# Patient Record
Sex: Male | Born: 1980
Health system: Southern US, Community
[De-identification: ages and names within clinical notes are randomized; demographics above are authoritative.]

## PROBLEM LIST (undated history)

## (undated) DIAGNOSIS — D6861 Antiphospholipid syndrome: Secondary | ICD-10-CM

## (undated) DIAGNOSIS — I82409 Acute embolism and thrombosis of unspecified deep veins of unspecified lower extremity: Secondary | ICD-10-CM

## (undated) HISTORY — PX: HERNIA REPAIR: SHX51

---

## 2012-07-27 DIAGNOSIS — S069XAA Unspecified intracranial injury with loss of consciousness status unknown, initial encounter: Secondary | ICD-10-CM

## 2012-07-27 DIAGNOSIS — S069X9A Unspecified intracranial injury with loss of consciousness of unspecified duration, initial encounter: Secondary | ICD-10-CM

## 2012-07-27 HISTORY — DX: Unspecified intracranial injury with loss of consciousness of unspecified duration, initial encounter: S06.9X9A

## 2012-07-27 HISTORY — DX: Unspecified intracranial injury with loss of consciousness status unknown, initial encounter: S06.9XAA

## 2014-11-06 DIAGNOSIS — D689 Coagulation defect, unspecified: Secondary | ICD-10-CM | POA: Insufficient documentation

## 2015-04-05 DIAGNOSIS — R600 Localized edema: Secondary | ICD-10-CM | POA: Insufficient documentation

## 2015-05-29 DIAGNOSIS — M7710 Lateral epicondylitis, unspecified elbow: Secondary | ICD-10-CM | POA: Insufficient documentation

## 2015-10-21 ENCOUNTER — Encounter (HOSPITAL_BASED_OUTPATIENT_CLINIC_OR_DEPARTMENT_OTHER): Attending: Internal Medicine

## 2015-10-21 DIAGNOSIS — D6861 Antiphospholipid syndrome: Secondary | ICD-10-CM | POA: Insufficient documentation

## 2015-10-21 DIAGNOSIS — L97229 Non-pressure chronic ulcer of left calf with unspecified severity: Secondary | ICD-10-CM | POA: Diagnosis present

## 2015-10-21 DIAGNOSIS — I739 Peripheral vascular disease, unspecified: Secondary | ICD-10-CM | POA: Diagnosis not present

## 2015-10-21 DIAGNOSIS — L97521 Non-pressure chronic ulcer of other part of left foot limited to breakdown of skin: Secondary | ICD-10-CM | POA: Diagnosis not present

## 2015-10-21 DIAGNOSIS — Z7901 Long term (current) use of anticoagulants: Secondary | ICD-10-CM | POA: Insufficient documentation

## 2015-10-28 ENCOUNTER — Encounter (HOSPITAL_BASED_OUTPATIENT_CLINIC_OR_DEPARTMENT_OTHER): Attending: Internal Medicine

## 2015-10-28 DIAGNOSIS — L97521 Non-pressure chronic ulcer of other part of left foot limited to breakdown of skin: Secondary | ICD-10-CM | POA: Diagnosis not present

## 2015-10-28 DIAGNOSIS — L97221 Non-pressure chronic ulcer of left calf limited to breakdown of skin: Secondary | ICD-10-CM | POA: Insufficient documentation

## 2015-10-28 DIAGNOSIS — D6861 Antiphospholipid syndrome: Secondary | ICD-10-CM | POA: Insufficient documentation

## 2015-11-04 DIAGNOSIS — L97521 Non-pressure chronic ulcer of other part of left foot limited to breakdown of skin: Secondary | ICD-10-CM | POA: Diagnosis not present

## 2015-11-11 DIAGNOSIS — L97521 Non-pressure chronic ulcer of other part of left foot limited to breakdown of skin: Secondary | ICD-10-CM | POA: Diagnosis not present

## 2016-05-15 ENCOUNTER — Encounter (HOSPITAL_BASED_OUTPATIENT_CLINIC_OR_DEPARTMENT_OTHER): Attending: Internal Medicine

## 2016-05-15 DIAGNOSIS — D6861 Antiphospholipid syndrome: Secondary | ICD-10-CM | POA: Diagnosis not present

## 2016-05-15 DIAGNOSIS — L97321 Non-pressure chronic ulcer of left ankle limited to breakdown of skin: Secondary | ICD-10-CM | POA: Diagnosis not present

## 2016-05-15 DIAGNOSIS — I87332 Chronic venous hypertension (idiopathic) with ulcer and inflammation of left lower extremity: Secondary | ICD-10-CM | POA: Diagnosis present

## 2016-05-15 DIAGNOSIS — Z7901 Long term (current) use of anticoagulants: Secondary | ICD-10-CM | POA: Diagnosis not present

## 2016-05-22 DIAGNOSIS — I87332 Chronic venous hypertension (idiopathic) with ulcer and inflammation of left lower extremity: Secondary | ICD-10-CM | POA: Diagnosis not present

## 2016-05-29 ENCOUNTER — Encounter (HOSPITAL_BASED_OUTPATIENT_CLINIC_OR_DEPARTMENT_OTHER): Attending: Internal Medicine

## 2016-09-10 ENCOUNTER — Ambulatory Visit (INDEPENDENT_AMBULATORY_CARE_PROVIDER_SITE_OTHER): Payer: Self-pay | Admitting: Orthopaedic Surgery

## 2016-09-29 ENCOUNTER — Ambulatory Visit (INDEPENDENT_AMBULATORY_CARE_PROVIDER_SITE_OTHER): Payer: Self-pay | Admitting: Orthopaedic Surgery

## 2016-10-13 ENCOUNTER — Ambulatory Visit (INDEPENDENT_AMBULATORY_CARE_PROVIDER_SITE_OTHER): Admitting: Orthopaedic Surgery

## 2016-10-13 ENCOUNTER — Ambulatory Visit (INDEPENDENT_AMBULATORY_CARE_PROVIDER_SITE_OTHER)

## 2016-10-13 ENCOUNTER — Encounter (INDEPENDENT_AMBULATORY_CARE_PROVIDER_SITE_OTHER): Payer: Self-pay | Admitting: Orthopaedic Surgery

## 2016-10-13 DIAGNOSIS — M25532 Pain in left wrist: Secondary | ICD-10-CM | POA: Diagnosis not present

## 2016-10-13 MED ORDER — LIDOCAINE HCL 1 % IJ SOLN
0.3000 mL | INTRAMUSCULAR | Status: AC | PRN
  Administered 2016-10-13: .3 mL

## 2016-10-13 MED ORDER — BUPIVACAINE HCL 0.5 % IJ SOLN
0.3300 mL | INTRAMUSCULAR | Status: AC | PRN
  Administered 2016-10-13: .33 mL

## 2016-10-13 MED ORDER — METHYLPREDNISOLONE ACETATE 40 MG/ML IJ SUSP
13.3300 mg | INTRAMUSCULAR | Status: AC | PRN
  Administered 2016-10-13: 13.33 mg

## 2016-10-13 NOTE — Progress Notes (Signed)
Office Visit Note   Patient: Vincent SievertDerek Warner           Date of Birth: 02/23/1981           MRN: [id number] Visit Date: 10/13/2016              Requested by: Vivien PrestoKip A Corrington, MD [address] [place], [place] [address] PCP: Vivien PrestoORRINGTON,KIP A, MD   Assessment & Plan: Visit Diagnoses:  1. 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 We did place in an injection today and this region of pain. Patient is on Coumadin for chronic DVTs therefore cannot take NSAIDs. Continue with relative rest ice and wrist brace. Follow-up with me as needed.  Follow-Up Instructions: Return if symptoms worsen or fail to improve.   Orders:  Orders Placed This Encounter  Procedures  . XR Wrist Complete Left   No orders of the defined types were placed in this encounter.     Procedures: Hand/UE Inj Date/Time: 10/13/2016 12:57 PM Performed by: Tarry KosXU, Azarah Dacy M Authorized by: Tarry KosXU, Drayton Tieu M   Consent Given by:  Patient Timeout: prior to procedure the correct patient, procedure, and site was verified   Indications:  Pain Condition: osteoarthritis   Location:  Thumb Thumb joint: CMC joint. Prep: patient was prepped and draped in usual sterile fashion   Needle Size:  25 G Medications:  0.3 mL lidocaine 1 %; 0.33 mL bupivacaine 0.5 %; 13.33 mg methylPREDNISolone acetate 40 MG/ML     Clinical Data: No additional findings.   Subjective: Chief Complaint  Patient presents with  . Left Wrist - Pain    Patient is a pleasant 36 year old gentleman with pain at the base of his first dorsal webspace of his left hand. He is right-hand dominant. He denies any injuries. Feels very sore. He does do a lot of weightlifting. He has tried a steroid taper with no relief. This is been going on for 6 weeks. He endorses throbbing pain. It does not radiate.    Review of Systems  Constitutional: Negative.   All other systems reviewed and are  negative.    Objective: Vital Signs: There were no vitals taken for this visit.  Physical Exam  Constitutional: He is oriented to person, place, and time. He appears well-developed and well-nourished.  HENT:  Head: Normocephalic and atraumatic.  Eyes: Pupils are equal, round, and reactive to light.  Neck: Neck supple.  Pulmonary/Chest: Effort normal.  Abdominal: Soft.  Musculoskeletal: Normal range of motion.  Neurological: He is alert and oriented to person, place, and time.  Skin: Skin is warm.  Psychiatric: He has a normal mood and affect. His behavior is normal. Judgment and thought content normal.  Nursing note and vitals reviewed.   Ortho Exam Left hand exam shows negative Finkelstein's, negative grind test. His pain is directly deep to the first and second metacarpal articulation. He has no gross deformities. Specialty Comments:  No specialty comments available.  Imaging: Xr Wrist Complete Left  Result Date: 10/13/2016 No acute or structural abnormalities no evidence of fracture    PMFS History: There are no active problems to display for this patient.  No past medical history on file.  No family history on file.  No past surgical history on file. Social History   Occupational History  . Not on file.   Social History Main Topics  . Smoking status: Never Smoker  . Smokeless tobacco: Never  Used  . Alcohol use Not on file  . Drug use: Unknown  . Sexual activity: Not on file

## 2016-10-28 ENCOUNTER — Encounter (HOSPITAL_BASED_OUTPATIENT_CLINIC_OR_DEPARTMENT_OTHER): Attending: Surgery

## 2016-10-28 DIAGNOSIS — I83023 Varicose veins of left lower extremity with ulcer of ankle: Secondary | ICD-10-CM | POA: Insufficient documentation

## 2016-10-28 DIAGNOSIS — Z7901 Long term (current) use of anticoagulants: Secondary | ICD-10-CM | POA: Diagnosis not present

## 2016-10-28 DIAGNOSIS — Z86718 Personal history of other venous thrombosis and embolism: Secondary | ICD-10-CM | POA: Diagnosis not present

## 2016-10-28 DIAGNOSIS — L97521 Non-pressure chronic ulcer of other part of left foot limited to breakdown of skin: Secondary | ICD-10-CM | POA: Insufficient documentation

## 2016-10-28 DIAGNOSIS — L97321 Non-pressure chronic ulcer of left ankle limited to breakdown of skin: Secondary | ICD-10-CM | POA: Diagnosis not present

## 2016-10-28 DIAGNOSIS — D6861 Antiphospholipid syndrome: Secondary | ICD-10-CM | POA: Insufficient documentation

## 2016-10-28 DIAGNOSIS — I83025 Varicose veins of left lower extremity with ulcer other part of foot: Secondary | ICD-10-CM | POA: Diagnosis not present

## 2016-11-02 ENCOUNTER — Encounter (HOSPITAL_BASED_OUTPATIENT_CLINIC_OR_DEPARTMENT_OTHER)

## 2016-11-05 DIAGNOSIS — I83023 Varicose veins of left lower extremity with ulcer of ankle: Secondary | ICD-10-CM | POA: Diagnosis not present

## 2016-11-13 DIAGNOSIS — I83023 Varicose veins of left lower extremity with ulcer of ankle: Secondary | ICD-10-CM | POA: Diagnosis not present

## 2016-11-20 DIAGNOSIS — I83023 Varicose veins of left lower extremity with ulcer of ankle: Secondary | ICD-10-CM | POA: Diagnosis not present

## 2016-11-23 DIAGNOSIS — I83023 Varicose veins of left lower extremity with ulcer of ankle: Secondary | ICD-10-CM | POA: Diagnosis not present

## 2016-11-27 ENCOUNTER — Encounter (HOSPITAL_BASED_OUTPATIENT_CLINIC_OR_DEPARTMENT_OTHER): Attending: Internal Medicine

## 2016-11-27 DIAGNOSIS — L97321 Non-pressure chronic ulcer of left ankle limited to breakdown of skin: Secondary | ICD-10-CM | POA: Insufficient documentation

## 2016-11-27 DIAGNOSIS — D6861 Antiphospholipid syndrome: Secondary | ICD-10-CM | POA: Insufficient documentation

## 2016-11-27 DIAGNOSIS — I83023 Varicose veins of left lower extremity with ulcer of ankle: Secondary | ICD-10-CM | POA: Diagnosis not present

## 2016-12-04 DIAGNOSIS — I83023 Varicose veins of left lower extremity with ulcer of ankle: Secondary | ICD-10-CM | POA: Diagnosis not present

## 2016-12-04 DIAGNOSIS — Z7901 Long term (current) use of anticoagulants: Secondary | ICD-10-CM | POA: Insufficient documentation

## 2016-12-07 ENCOUNTER — Other Ambulatory Visit (HOSPITAL_COMMUNITY)
Admission: RE | Admit: 2016-12-07 | Discharge: 2016-12-07 | Disposition: A | Discharge status: Home / Self Care | Source: Other Acute Inpatient Hospital | Attending: Internal Medicine | Admitting: Internal Medicine

## 2016-12-07 DIAGNOSIS — L03116 Cellulitis of left lower limb: Secondary | ICD-10-CM | POA: Insufficient documentation

## 2016-12-07 DIAGNOSIS — I83023 Varicose veins of left lower extremity with ulcer of ankle: Secondary | ICD-10-CM | POA: Diagnosis not present

## 2016-12-09 LAB — AEROBIC CULTURE W GRAM STAIN (SUPERFICIAL SPECIMEN)

## 2016-12-09 LAB — AEROBIC CULTURE  (SUPERFICIAL SPECIMEN)

## 2017-01-07 ENCOUNTER — Ambulatory Visit (INDEPENDENT_AMBULATORY_CARE_PROVIDER_SITE_OTHER)

## 2017-01-07 ENCOUNTER — Ambulatory Visit (INDEPENDENT_AMBULATORY_CARE_PROVIDER_SITE_OTHER): Admitting: Orthopaedic Surgery

## 2017-01-07 DIAGNOSIS — M545 Low back pain, unspecified: Secondary | ICD-10-CM | POA: Insufficient documentation

## 2017-01-07 DIAGNOSIS — M5442 Lumbago with sciatica, left side: Secondary | ICD-10-CM | POA: Diagnosis not present

## 2017-01-07 MED ORDER — PREDNISONE 10 MG (21) PO TBPK: ORAL_TABLET | ORAL | 0 refills | Status: DC

## 2017-01-07 MED ORDER — METHOCARBAMOL 750 MG PO TABS: 750.0000 mg | ORAL_TABLET | Freq: Two times a day (BID) | ORAL | 0 refills | Status: DC | PRN

## 2017-01-07 NOTE — Progress Notes (Signed)
   Office Visit Note   Patient: Vincent SievertDerek Warner           Date of Birth: 03/28/1981           MRN: [id number] Visit Date: 01/07/2017              Requested by: Vivien Prestoorrington, Kip A, MD [phone] [place], [place] [address] PCP: Vivien Prestoorrington, Kip A, MD   Assessment & Plan: Visit Diagnoses:  1. Acute bilateral low back pain without sciatica     Plan: Acute lumbar strain. Robaxin and prednisone taper prescribed. Patient cannot take NSAIDs due to his Coumadin. Follow-up with me as needed. Questions encouraged and answered.  Follow-Up Instructions: Return if symptoms worsen or fail to improve.   Orders:  Orders Placed This Encounter  Procedures  . XR Lumbar Spine 2-3 Views   Meds ordered this encounter  Medications  . predniSONE (STERAPRED UNI-PAK 21 TAB) 10 MG (21) TBPK tablet    Sig: Take as directed    Dispense:  21 tablet    Refill:  0  . methocarbamol (ROBAXIN) 750 MG tablet    Sig: Take 1 tablet (750 mg total) by mouth 2 (two) times daily as needed for muscle spasms.    Dispense:  60 tablet    Refill:  0      Procedures: No procedures performed   Clinical Data: No additional findings.   Subjective: No chief complaint on file.   [name] comes in today with acute low back pain. He was doing a lot of weightlifting and he has had pain in his low back since then. He is feeling spasms. He feels like him back. He is not able to get comfortable. He does take Coumadin for chronic DVTs.    Review of Systems  Constitutional: Negative.   All other systems reviewed and are negative.    Objective: Vital Signs: There were no vitals taken for this visit.  Physical Exam  Constitutional: He is oriented to person, place, and time. He appears well-developed and well-nourished.  Pulmonary/Chest: Effort normal.  Abdominal: Soft.  Neurological: He is alert and oriented to person, place, and time.  Skin: Skin is warm.  Psychiatric: He has a normal mood and affect. His  behavior is normal. Judgment and thought content normal.  Nursing note and vitals reviewed.   Ortho Exam Bilateral lower extremity exam is normal. Motor sensory and reflexes. No focal deficits. Specialty Comments:  No specialty comments available.  Imaging: Xr Lumbar Spine 2-3 Views  Result Date: 01/07/2017 Positional scoliosis.  No DDD.    PMFS History: Patient Active Problem List   Diagnosis Date Noted  . Acute bilateral low back pain without sciatica 01/07/2017   No past medical history on file.  No family history on file.  No past surgical history on file. Social History   Occupational History  . Not on file.   Social History Main Topics  . Smoking status: Never Smoker  . Smokeless tobacco: Never Used  . Alcohol use Not on file  . Drug use: Unknown  . Sexual activity: Not on file

## 2017-03-15 ENCOUNTER — Encounter (HOSPITAL_BASED_OUTPATIENT_CLINIC_OR_DEPARTMENT_OTHER): Attending: Internal Medicine

## 2017-03-15 DIAGNOSIS — D6861 Antiphospholipid syndrome: Secondary | ICD-10-CM | POA: Insufficient documentation

## 2017-03-15 DIAGNOSIS — Z7901 Long term (current) use of anticoagulants: Secondary | ICD-10-CM | POA: Diagnosis not present

## 2017-03-15 DIAGNOSIS — L97311 Non-pressure chronic ulcer of right ankle limited to breakdown of skin: Secondary | ICD-10-CM | POA: Diagnosis not present

## 2017-03-15 DIAGNOSIS — I87332 Chronic venous hypertension (idiopathic) with ulcer and inflammation of left lower extremity: Secondary | ICD-10-CM | POA: Insufficient documentation

## 2017-03-24 ENCOUNTER — Other Ambulatory Visit (HOSPITAL_COMMUNITY): Payer: Self-pay | Admitting: Gastroenterology

## 2017-03-24 DIAGNOSIS — E237 Disorder of pituitary gland, unspecified: Secondary | ICD-10-CM

## 2017-04-01 ENCOUNTER — Encounter (HOSPITAL_BASED_OUTPATIENT_CLINIC_OR_DEPARTMENT_OTHER): Attending: Internal Medicine

## 2017-04-01 DIAGNOSIS — L97311 Non-pressure chronic ulcer of right ankle limited to breakdown of skin: Secondary | ICD-10-CM | POA: Insufficient documentation

## 2017-04-01 DIAGNOSIS — Z7901 Long term (current) use of anticoagulants: Secondary | ICD-10-CM | POA: Diagnosis not present

## 2017-04-01 DIAGNOSIS — D6861 Antiphospholipid syndrome: Secondary | ICD-10-CM | POA: Insufficient documentation

## 2017-04-01 DIAGNOSIS — I87332 Chronic venous hypertension (idiopathic) with ulcer and inflammation of left lower extremity: Secondary | ICD-10-CM | POA: Diagnosis not present

## 2017-04-01 DIAGNOSIS — L97322 Non-pressure chronic ulcer of left ankle with fat layer exposed: Secondary | ICD-10-CM | POA: Insufficient documentation

## 2017-04-05 ENCOUNTER — Ambulatory Visit (HOSPITAL_COMMUNITY)
Admission: RE | Admit: 2017-04-05 | Discharge: 2017-04-05 | Disposition: A | Discharge status: Home / Self Care | Source: Ambulatory Visit | Attending: Gastroenterology | Admitting: Gastroenterology

## 2017-04-05 DIAGNOSIS — E237 Disorder of pituitary gland, unspecified: Secondary | ICD-10-CM | POA: Diagnosis present

## 2017-04-05 MED ORDER — GADOBENATE DIMEGLUMINE 529 MG/ML IV SOLN
10.0000 mL | Freq: Once | INTRAVENOUS | Status: AC | PRN
  Administered 2017-04-05: 10 mL via INTRAVENOUS

## 2017-04-08 DIAGNOSIS — I87332 Chronic venous hypertension (idiopathic) with ulcer and inflammation of left lower extremity: Secondary | ICD-10-CM | POA: Diagnosis not present

## 2017-04-15 DIAGNOSIS — I87332 Chronic venous hypertension (idiopathic) with ulcer and inflammation of left lower extremity: Secondary | ICD-10-CM | POA: Diagnosis not present

## 2017-04-22 ENCOUNTER — Other Ambulatory Visit (HOSPITAL_COMMUNITY)
Admission: RE | Admit: 2017-04-22 | Discharge: 2017-04-22 | Disposition: A | Discharge status: Home / Self Care | Source: Other Acute Inpatient Hospital | Attending: Internal Medicine | Admitting: Internal Medicine

## 2017-04-22 DIAGNOSIS — L97311 Non-pressure chronic ulcer of right ankle limited to breakdown of skin: Secondary | ICD-10-CM | POA: Insufficient documentation

## 2017-04-22 DIAGNOSIS — I87332 Chronic venous hypertension (idiopathic) with ulcer and inflammation of left lower extremity: Secondary | ICD-10-CM | POA: Diagnosis not present

## 2017-04-28 LAB — AEROBIC/ANAEROBIC CULTURE (SURGICAL/DEEP WOUND)

## 2017-04-28 LAB — AEROBIC/ANAEROBIC CULTURE W GRAM STAIN (SURGICAL/DEEP WOUND)

## 2017-05-06 ENCOUNTER — Encounter (HOSPITAL_BASED_OUTPATIENT_CLINIC_OR_DEPARTMENT_OTHER): Attending: Internal Medicine

## 2017-05-06 DIAGNOSIS — I739 Peripheral vascular disease, unspecified: Secondary | ICD-10-CM | POA: Diagnosis not present

## 2017-05-06 DIAGNOSIS — D6861 Antiphospholipid syndrome: Secondary | ICD-10-CM | POA: Insufficient documentation

## 2017-05-06 DIAGNOSIS — I87332 Chronic venous hypertension (idiopathic) with ulcer and inflammation of left lower extremity: Secondary | ICD-10-CM | POA: Insufficient documentation

## 2017-05-06 DIAGNOSIS — Z86718 Personal history of other venous thrombosis and embolism: Secondary | ICD-10-CM | POA: Insufficient documentation

## 2017-05-06 DIAGNOSIS — B372 Candidiasis of skin and nail: Secondary | ICD-10-CM | POA: Insufficient documentation

## 2017-05-06 DIAGNOSIS — L97322 Non-pressure chronic ulcer of left ankle with fat layer exposed: Secondary | ICD-10-CM | POA: Diagnosis not present

## 2017-05-13 DIAGNOSIS — I87332 Chronic venous hypertension (idiopathic) with ulcer and inflammation of left lower extremity: Secondary | ICD-10-CM | POA: Diagnosis not present

## 2017-05-20 DIAGNOSIS — I87332 Chronic venous hypertension (idiopathic) with ulcer and inflammation of left lower extremity: Secondary | ICD-10-CM | POA: Diagnosis not present

## 2017-05-27 ENCOUNTER — Encounter (HOSPITAL_BASED_OUTPATIENT_CLINIC_OR_DEPARTMENT_OTHER)

## 2017-05-31 DIAGNOSIS — IMO0001 Reserved for inherently not codable concepts without codable children: Secondary | ICD-10-CM | POA: Insufficient documentation

## 2017-08-03 ENCOUNTER — Encounter (HOSPITAL_BASED_OUTPATIENT_CLINIC_OR_DEPARTMENT_OTHER): Attending: Internal Medicine

## 2017-08-03 DIAGNOSIS — Z86718 Personal history of other venous thrombosis and embolism: Secondary | ICD-10-CM | POA: Diagnosis not present

## 2017-08-03 DIAGNOSIS — Z7901 Long term (current) use of anticoagulants: Secondary | ICD-10-CM | POA: Diagnosis not present

## 2017-08-03 DIAGNOSIS — D6861 Antiphospholipid syndrome: Secondary | ICD-10-CM | POA: Insufficient documentation

## 2017-08-03 DIAGNOSIS — I87332 Chronic venous hypertension (idiopathic) with ulcer and inflammation of left lower extremity: Secondary | ICD-10-CM | POA: Insufficient documentation

## 2017-08-03 DIAGNOSIS — L97322 Non-pressure chronic ulcer of left ankle with fat layer exposed: Secondary | ICD-10-CM | POA: Diagnosis not present

## 2017-08-09 DIAGNOSIS — I87332 Chronic venous hypertension (idiopathic) with ulcer and inflammation of left lower extremity: Secondary | ICD-10-CM | POA: Diagnosis not present

## 2017-08-16 DIAGNOSIS — I87332 Chronic venous hypertension (idiopathic) with ulcer and inflammation of left lower extremity: Secondary | ICD-10-CM | POA: Diagnosis not present

## 2018-07-11 ENCOUNTER — Encounter: Payer: Self-pay | Admitting: Endocrinology

## 2018-07-18 ENCOUNTER — Ambulatory Visit (INDEPENDENT_AMBULATORY_CARE_PROVIDER_SITE_OTHER): Admitting: Internal Medicine

## 2018-07-18 VITALS — BP 130/70 | HR 74 | Ht 78.0 in | Wt 251.0 lb

## 2018-07-18 DIAGNOSIS — S069X9S Unspecified intracranial injury with loss of consciousness of unspecified duration, sequela: Secondary | ICD-10-CM | POA: Diagnosis not present

## 2018-07-18 DIAGNOSIS — Z8639 Personal history of other endocrine, nutritional and metabolic disease: Secondary | ICD-10-CM | POA: Diagnosis not present

## 2018-07-18 NOTE — Patient Instructions (Signed)
-   Please continue current Testosterone regimen 60 mg subcutaneous three times a week - Continue HCG at 50 units three times a week
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
## 2018-07-19 ENCOUNTER — Encounter: Payer: Self-pay | Admitting: Internal Medicine

## 2018-07-19 ENCOUNTER — Telehealth: Payer: Self-pay

## 2018-07-19 DIAGNOSIS — S069X9A Unspecified intracranial injury with loss of consciousness of unspecified duration, initial encounter: Secondary | ICD-10-CM | POA: Insufficient documentation

## 2018-07-19 DIAGNOSIS — Z86718 Personal history of other venous thrombosis and embolism: Secondary | ICD-10-CM | POA: Insufficient documentation

## 2018-07-19 DIAGNOSIS — Z8639 Personal history of other endocrine, nutritional and metabolic disease: Secondary | ICD-10-CM | POA: Insufficient documentation

## 2018-07-19 MED ORDER — CHORIONIC GONADOTROPIN 10000 UNITS IM SOLR: 500.0000 [IU] | INTRAMUSCULAR | 5 refills | Status: DC

## 2018-07-19 MED ORDER — "INSULIN SYRINGE 29G X 1/2"" 0.5 ML MISC": 6 refills | Status: DC

## 2018-07-19 MED ORDER — TESTOSTERONE CYPIONATE 200 MG/ML IM SOLN: 60.0000 mg | INTRAMUSCULAR | 4 refills | Status: DC

## 2018-07-19 MED ORDER — CHORIONIC GONADOTROPIN 10000 UNITS IM SOLR: 50.0000 [IU] | INTRAMUSCULAR | 5 refills | Status: DC

## 2018-07-19 NOTE — Telephone Encounter (Signed)
Please refill HCG for Pt.

## 2018-10-11 DIAGNOSIS — R41840 Attention and concentration deficit: Secondary | ICD-10-CM | POA: Insufficient documentation

## 2018-10-17 ENCOUNTER — Ambulatory Visit: Admitting: Internal Medicine

## 2019-03-01 ENCOUNTER — Encounter (HOSPITAL_BASED_OUTPATIENT_CLINIC_OR_DEPARTMENT_OTHER)

## 2019-06-08 DIAGNOSIS — I82519 Chronic embolism and thrombosis of unspecified femoral vein: Secondary | ICD-10-CM | POA: Insufficient documentation

## 2019-06-29 DIAGNOSIS — F900 Attention-deficit hyperactivity disorder, predominantly inattentive type: Secondary | ICD-10-CM | POA: Insufficient documentation

## 2019-08-16 ENCOUNTER — Ambulatory Visit: Payer: Self-pay | Attending: Internal Medicine

## 2019-08-16 DIAGNOSIS — Z20822 Contact with and (suspected) exposure to covid-19: Secondary | ICD-10-CM | POA: Insufficient documentation

## 2019-08-17 LAB — NOVEL CORONAVIRUS, NAA: SARS-CoV-2, NAA: NOT DETECTED

## 2019-12-12 DIAGNOSIS — J3089 Other allergic rhinitis: Secondary | ICD-10-CM | POA: Insufficient documentation

## 2019-12-12 DIAGNOSIS — Z800 Family history of malignant neoplasm of digestive organs: Secondary | ICD-10-CM | POA: Insufficient documentation

## 2020-03-01 ENCOUNTER — Telehealth: Payer: Self-pay | Admitting: Internal Medicine

## 2020-03-01 NOTE — Telephone Encounter (Signed)
Please decline for me, thanks.

## 2020-03-01 NOTE — Telephone Encounter (Signed)
Patient has only been seen one time and it was last year - but he called wanting to schedule a follow up and he said he wanted to see a male Dr instead - sending to Dr Everardo All to request switching to him.

## 2020-03-01 NOTE — Telephone Encounter (Signed)
Please refer to Dr. Ellison's response below 

## 2020-03-01 NOTE — Telephone Encounter (Signed)
Please review and advise.

## 2020-03-18 ENCOUNTER — Ambulatory Visit (INDEPENDENT_AMBULATORY_CARE_PROVIDER_SITE_OTHER): Admitting: Internal Medicine

## 2020-03-18 ENCOUNTER — Other Ambulatory Visit: Payer: Self-pay

## 2020-03-18 VITALS — BP 118/68 | HR 84 | Wt 241.4 lb

## 2020-03-18 DIAGNOSIS — E230 Hypopituitarism: Secondary | ICD-10-CM | POA: Diagnosis not present

## 2020-03-18 DIAGNOSIS — S069X9S Unspecified intracranial injury with loss of consciousness of unspecified duration, sequela: Secondary | ICD-10-CM | POA: Diagnosis not present

## 2020-03-18 LAB — COMPREHENSIVE METABOLIC PANEL
ALT: 30 U/L (ref 0–53)
AST: 24 U/L (ref 0–37)
Albumin: 4.7 g/dL (ref 3.5–5.2)
Alkaline Phosphatase: 53 U/L (ref 39–117)
BUN: 20 mg/dL (ref 6–23)
CO2: 29 mEq/L (ref 19–32)
Calcium: 9.9 mg/dL (ref 8.4–10.5)
Chloride: 99 mEq/L (ref 96–112)
Creatinine, Ser: 1.12 mg/dL (ref 0.40–1.50)
GFR: 72.17 mL/min (ref >60.00)
Glucose, Bld: 55 mg/dL — ABNORMAL LOW (ref 70–99)
Potassium: 4.2 mEq/L (ref 3.5–5.1)
Sodium: 137 mEq/L (ref 135–145)
Total Bilirubin: 0.8 mg/dL (ref 0.2–1.2)
Total Protein: 7.6 g/dL (ref 6.0–8.3)

## 2020-03-18 LAB — CBC WITH DIFFERENTIAL/PLATELET
Basophils Absolute: 0 10*3/uL (ref 0.0–0.1)
Basophils Relative: 0.5 % (ref 0.0–3.0)
Eosinophils Absolute: 0.7 10*3/uL (ref 0.0–0.7)
Eosinophils Relative: 8.1 % — ABNORMAL HIGH (ref 0.0–5.0)
HCT: 51.3 % (ref 39.0–52.0)
Hemoglobin: 17.2 g/dL — ABNORMAL HIGH (ref 13.0–17.0)
Lymphocytes Relative: 20.2 % (ref 12.0–46.0)
Lymphs Abs: 1.8 10*3/uL (ref 0.7–4.0)
MCHC: 33.6 g/dL (ref 30.0–36.0)
MCV: 88.3 fl (ref 78.0–100.0)
Monocytes Absolute: 0.9 10*3/uL (ref 0.1–1.0)
Monocytes Relative: 10.1 % (ref 3.0–12.0)
Neutro Abs: 5.6 10*3/uL (ref 1.4–7.7)
Neutrophils Relative %: 61.1 % (ref 43.0–77.0)
Platelets: 254 10*3/uL (ref 150.0–400.0)
RBC: 5.81 Mil/uL (ref 4.22–5.81)
RDW: 13 % (ref 11.5–15.5)
WBC: 9.1 10*3/uL (ref 4.0–10.5)

## 2020-03-18 LAB — PSA: PSA: 0.75 ng/mL (ref 0.10–4.00)

## 2020-03-18 LAB — TESTOSTERONE: Testosterone: 793.02 ng/dL (ref 300.00–890.00)
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
## 2020-03-18 NOTE — Patient Instructions (Signed)
Labs today please.

## 2020-03-19 MED ORDER — TESTOSTERONE CYPIONATE 200 MG/ML IM SOLN: 40.0000 mg | INTRAMUSCULAR | 4 refills | Status: DC

## 2020-03-21 ENCOUNTER — Encounter: Payer: Self-pay | Admitting: Internal Medicine

## 2020-03-21 ENCOUNTER — Ambulatory Visit: Admitting: Internal Medicine

## 2020-04-08 ENCOUNTER — Ambulatory Visit: Payer: Self-pay | Admitting: Gastroenterology

## 2020-08-07 ENCOUNTER — Telehealth: Payer: Self-pay | Admitting: Internal Medicine

## 2020-08-07 NOTE — Telephone Encounter (Signed)
Patient called to request the following RX's: human chorionic gonadotropin (PREGNYL/NOVAREL) 10000 units injection AND testosterone cypionate (DEPOTESTOSTERONE CYPIONATE) 200 MG/ML injection be sent to [facility] - [place], [place] - [address] Phone:  [phone]  Fax:  [phone]

## 2020-08-07 NOTE — Telephone Encounter (Signed)
Ok to refill? Last prescribed on 07/20/2018. Last OV on 03/18/2020.  Not sure why patient is schedule to see Dr Lucianne Muss on 08/28/2020

## 2020-08-08 MED ORDER — CHORIONIC GONADOTROPIN 10000 UNITS IM SOLR: 500.0000 [IU] | INTRAMUSCULAR | 5 refills | Status: DC

## 2020-08-08 NOTE — Telephone Encounter (Signed)
I refilled it. He wanted a male provider that's why he switched to Dr. Lucianne Muss. Moving forward Dr. Lucianne Muss will refill

## 2020-08-08 NOTE — Telephone Encounter (Signed)
Noted  

## 2020-08-27 NOTE — Progress Notes (Addendum)
Patient ID: Vincent Warner, male   DOB: 02/06/1981, 40 y.o.   MRN: [id number]            Reason for consultation: Low T   Chief complaint:   History of Present Illness  He is transferring care from Dr. Everlene Farrier.  Hypogonadism was diagnosed in 2014  Initially he had complaints of  significant fatigability and tiredness, decreased libido, but no gynecomastia The symptoms started sometime after he had head injury in military action Apparently had no other evidence of hypopituitarism or structural change in the pituitary gland on MRI.  Prolactin 20          There is no history of the following: Hot flushes, , long term anabolic steroid use, history of testicular injury mumps in childhood.   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  With his testosterone injections he feels much better although still tends to get tired He takes his injections on Monday/Wednesday and Friday using a 29-gauge needle  His lowest testosterone at baseline was 153 in 2016 and also subsequently was shown to have a low free testosterone  He is taking 40 mg testosterone 3 days a week, previously had been taking relatively higher dose from outside endocrinologist  Testosterone levels as follows:  Lab Results  Component Value Date   TESTOSTERONE 793.02 03/18/2020    Prolactin level: 20 at baseline  No results found for: [facility]  Lab Results  Component Value Date   HGB 17.2 (H) 03/18/2020                 Allergies as of 08/28/2020      Reactions   Tegaderm Ag Mesh [silver] Rash      Medication List       Accurate as of August 28, 2020  9:01 AM. If you have any questions, ask your nurse or doctor.        apixaban 5 MG  Tabs tablet Commonly known as: ELIQUIS Take 5 mg by mouth 2 (two) times daily.   human chorionic gonadotropin 88416 units injection Commonly known as: PREGNYL/NOVAREL Inject 0.5 mLs (500 Units total) into the muscle 3 (three) times a week.   INSULIN SYRINGE .5CC/29G 29G X 1/2" 0.5 ML Misc Commonly known as: Advocate Insulin Syringe Total 6 x a week   testosterone cypionate 200 MG/ML injection Commonly known as: DEPOTESTOSTERONE CYPIONATE Inject 0.2 mLs (40 mg total) into the muscle 3 (three) times a week.       Allergies:  Allergies  Allergen Reactions  . Tegaderm Ag Mesh [Silver] Rash    History reviewed. No pertinent past medical history.  History reviewed. No pertinent surgical history.  History reviewed. No pertinent family history.  Social History:  reports that he has never smoked. He has never used smokeless tobacco. No history on file for alcohol use and drug use.  Review of Systems  Constitutional:       Tends to have some sweating at night  HENT: Negative for headaches.   Eyes: Negative for blurred vision.  Endocrine: Negative for cold intolerance and decreased libido.  Neurological: Negative for weakness.      General Examination:   BP 128/82   Pulse 94  Ht 6\' 6"  (1.981 m)   Wt 253 lb 6.4 oz (114.9 kg)   SpO2 97%   BMI 29.28 kg/m   GENERAL APPEARANCE  he is well built and nourished  SKIN: normal, no rash  EYES: normal external appearance of eyes         NECK: no lymphadenopathy, no thyromegaly.         CHEST: Gynecomastia absent  LUNGS: clear to auscultation bilaterally, no wheezes, rhonchi, rales.           HEART: normal S1 And S2, no S3, S4, murmur or click.          ABDOMEN: no hepatosplenomegaly, no mass palpated, soft and not distended  MALE GENITOURINARY: left testicle relatively softer but normal size.  Right testicle is normal   MUSCULOSKELETAL No enlargement or deformity of joints.          EXTREMITIES: no clubbing, no  edema.         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  Discussed pros and cons of various treatments Discussed that he may have a tendency to higher hematocrit levels with testosterone injections and his hemoglobin was slightly increased on the last measurement  Currently he does not want to change his injection regimen and prefers to do this 3 days a week also  Since he wants to potentially preserve fertility he had wanted to continue hCG injections but he has not been able to get this for about 2 months.  Discussed that clomiphene will achieve the same benefit on testosterone production and spermatogenesis; may also be able to reduce the testosterone use Most likely will use clomiphene 25 mg 3 days a week  Labs will be checked today and also free T4 checked to rule out secondary hypothyroidism Blood counts and liver function will also be checked as no recent labs available    08/28/2020, 9:01 AM   Total encounter time including evaluation and management, reviewing historical previous records and labs, counseling =40 minutes  Note: This office note was prepared with Dragon voice recognition system technology. Any transcriptional errors that result from this process are unintentional.

## 2020-08-28 ENCOUNTER — Other Ambulatory Visit: Payer: Self-pay

## 2020-08-28 ENCOUNTER — Ambulatory Visit (INDEPENDENT_AMBULATORY_CARE_PROVIDER_SITE_OTHER): Admitting: Endocrinology

## 2020-08-28 ENCOUNTER — Encounter: Payer: Self-pay | Admitting: Endocrinology

## 2020-08-28 VITALS — BP 128/82 | HR 94 | Ht 78.0 in | Wt 253.4 lb

## 2020-08-28 DIAGNOSIS — E230 Hypopituitarism: Secondary | ICD-10-CM | POA: Diagnosis not present

## 2020-08-28 DIAGNOSIS — R5383 Other fatigue: Secondary | ICD-10-CM | POA: Diagnosis not present

## 2020-08-28 LAB — CBC
HCT: 48.5 % (ref 39.0–52.0)
Hemoglobin: 16.9 g/dL (ref 13.0–17.0)
MCHC: 34.8 g/dL (ref 30.0–36.0)
MCV: 86.1 fl (ref 78.0–100.0)
Platelets: 234 10*3/uL (ref 150.0–400.0)
RBC: 5.63 Mil/uL (ref 4.22–5.81)
RDW: 13.3 % (ref 11.5–15.5)
WBC: 7.7 10*3/uL (ref 4.0–10.5)

## 2020-08-28 LAB — TESTOSTERONE: Testosterone: 262.85 ng/dL — ABNORMAL LOW (ref 300.00–890.00)

## 2020-08-28 LAB — COMPREHENSIVE METABOLIC PANEL
ALT: 51 U/L (ref 0–53)
AST: 30 U/L (ref 0–37)
Albumin: 4.2 g/dL (ref 3.5–5.2)
Alkaline Phosphatase: 43 U/L (ref 39–117)
BUN: 23 mg/dL (ref 6–23)
CO2: 29 mEq/L (ref 19–32)
Calcium: 9.4 mg/dL (ref 8.4–10.5)
Chloride: 102 mEq/L (ref 96–112)
Creatinine, Ser: 0.95 mg/dL (ref 0.40–1.50)
GFR: 99.31 mL/min (ref >60.00)
Glucose, Bld: 89 mg/dL (ref 70–99)
Potassium: 4.1 mEq/L (ref 3.5–5.1)
Sodium: 136 mEq/L (ref 135–145)
Total Bilirubin: 0.7 mg/dL (ref 0.2–1.2)
Total Protein: 7.1 g/dL (ref 6.0–8.3)

## 2020-08-28 LAB — T4, FREE: Free T4: 0.94 ng/dL (ref 0.60–1.60)

## 2020-08-28 NOTE — Progress Notes (Signed)
Testosterone level is only 263, confirm that he has not missed any doses of injections lately. Since red blood cell count is still upper normal will not increase testosterone but start clomiphene tablets 50 mg, half tablet 3 days a week and follow-up as scheduled.

## 2020-09-02 ENCOUNTER — Other Ambulatory Visit: Payer: Self-pay | Admitting: *Deleted

## 2020-09-02 ENCOUNTER — Telehealth: Payer: Self-pay | Admitting: Endocrinology

## 2020-09-02 MED ORDER — TESTOSTERONE CYPIONATE 200 MG/ML IM SOLN: 40.0000 mg | INTRAMUSCULAR | 4 refills | Status: DC

## 2020-09-02 MED ORDER — CLOMIPHENE CITRATE 50 MG PO TABS: ORAL_TABLET | ORAL | 2 refills | Status: DC

## 2020-09-02 MED ORDER — CLOMIPHENE CITRATE 50 MG PO TABS: ORAL_TABLET | ORAL | 1 refills | Status: DC

## 2020-09-02 NOTE — Telephone Encounter (Signed)
Patient called and requested a refill of Testosterone Cypionate to be sent to [facility].  Patient is also requests that his Clomid be resent to [facility] as well.  Patient request that [facility] be his pharmacy of choice for long term medication and that [facility] be used for RX that are one time or immediate need fills

## 2020-09-02 NOTE — Telephone Encounter (Signed)
Rx has been printed and on Dr. Remus Blake desk for signature.

## 2020-09-13 ENCOUNTER — Telehealth: Payer: Self-pay | Admitting: Endocrinology

## 2020-09-13 ENCOUNTER — Other Ambulatory Visit: Payer: Self-pay | Admitting: *Deleted

## 2020-09-13 MED ORDER — "INSULIN SYRINGE 29G X 1/2"" 0.5 ML MISC": 6 refills | Status: DC

## 2020-09-13 NOTE — Telephone Encounter (Signed)
MEDICATION: insulin syringes  PHARMACY:    [facility] - [place], [place] - [address] Phone:  [phone]  Fax:  [phone]     HAS THE PATIENT CONTACTED THEIR PHARMACY?  no  IS THIS A 90 DAY SUPPLY : no  IS PATIENT OUT OF MEDICATION: no  LAST APPOINTMENT DATE: @2 /01/2021  NEXT APPOINTMENT DATE:@4 /07/2020   **Let patient know to contact pharmacy at the end of the day to make sure medication is ready. **  ** Please notify patient to allow 48-72 hours to process**  **Encourage patient to contact the pharmacy for refills or they can request refills through [facility]**

## 2020-09-13 NOTE — Telephone Encounter (Signed)
Rx sent 

## 2020-09-23 ENCOUNTER — Ambulatory Visit: Admitting: Endocrinology

## 2020-10-07 ENCOUNTER — Telehealth: Payer: Self-pay | Admitting: Internal Medicine

## 2020-10-07 NOTE — Telephone Encounter (Signed)
Please advise 

## 2020-10-07 NOTE — Telephone Encounter (Signed)
Patient has requested out bound referral to [facility]. Please send all records.

## 2020-10-07 NOTE — Telephone Encounter (Signed)
Message left for patient to return my call.  

## 2020-10-07 NOTE — Telephone Encounter (Signed)
He is Dr. Remus Blake

## 2020-10-07 NOTE — Telephone Encounter (Signed)
I am not referring him to urology, this is his own choice and will need to be done by his PCP

## 2020-10-25 ENCOUNTER — Other Ambulatory Visit

## 2020-10-30 ENCOUNTER — Ambulatory Visit: Admitting: Endocrinology

## 2021-01-17 ENCOUNTER — Emergency Department (HOSPITAL_BASED_OUTPATIENT_CLINIC_OR_DEPARTMENT_OTHER)

## 2021-01-17 ENCOUNTER — Encounter (HOSPITAL_BASED_OUTPATIENT_CLINIC_OR_DEPARTMENT_OTHER): Payer: Self-pay | Admitting: Emergency Medicine

## 2021-01-17 ENCOUNTER — Other Ambulatory Visit: Payer: Self-pay

## 2021-01-17 ENCOUNTER — Emergency Department (HOSPITAL_BASED_OUTPATIENT_CLINIC_OR_DEPARTMENT_OTHER)
Admission: EM | Admit: 2021-01-17 | Discharge: 2021-01-17 | Disposition: A | Discharge status: Home / Self Care | Attending: Emergency Medicine | Admitting: Emergency Medicine

## 2021-01-17 DIAGNOSIS — J36 Peritonsillar abscess: Secondary | ICD-10-CM

## 2021-01-17 DIAGNOSIS — D6861 Antiphospholipid syndrome: Secondary | ICD-10-CM | POA: Insufficient documentation

## 2021-01-17 DIAGNOSIS — Z20822 Contact with and (suspected) exposure to covid-19: Secondary | ICD-10-CM | POA: Diagnosis not present

## 2021-01-17 DIAGNOSIS — J029 Acute pharyngitis, unspecified: Secondary | ICD-10-CM | POA: Diagnosis present

## 2021-01-17 HISTORY — DX: Antiphospholipid syndrome: D68.61

## 2021-01-17 LAB — COMPREHENSIVE METABOLIC PANEL
ALT: 25 U/L (ref 0–44)
AST: 26 U/L (ref 15–41)
Albumin: 4.6 g/dL (ref 3.5–5.0)
Alkaline Phosphatase: 50 U/L (ref 38–126)
Anion gap: 10 (ref 5–15)
BUN: 14 mg/dL (ref 6–20)
CO2: 26 mmol/L (ref 22–32)
Calcium: 9.9 mg/dL (ref 8.9–10.3)
Chloride: 97 mmol/L — ABNORMAL LOW (ref 98–111)
Creatinine, Ser: 1.07 mg/dL (ref 0.61–1.24)
GFR, Estimated: >60 mL/min (ref >60)
Glucose, Bld: 92 mg/dL (ref 70–99)
Potassium: 3.9 mmol/L (ref 3.5–5.1)
Sodium: 133 mmol/L — ABNORMAL LOW (ref 135–145)
Total Bilirubin: 0.6 mg/dL (ref 0.3–1.2)
Total Protein: 8.5 g/dL — ABNORMAL HIGH (ref 6.5–8.1)

## 2021-01-17 LAB — CBC WITH DIFFERENTIAL/PLATELET
Abs Immature Granulocytes: 0.17 10*3/uL — ABNORMAL HIGH (ref 0.00–0.07)
Basophils Absolute: 0.1 10*3/uL (ref 0.0–0.1)
Basophils Relative: 0 %
Eosinophils Absolute: 0.1 10*3/uL (ref 0.0–0.5)
Eosinophils Relative: 1 %
HCT: 51.7 % (ref 39.0–52.0)
Hemoglobin: 17.7 g/dL — ABNORMAL HIGH (ref 13.0–17.0)
Immature Granulocytes: 1 %
Lymphocytes Relative: 8 %
Lymphs Abs: 1.6 10*3/uL (ref 0.7–4.0)
MCH: 29.4 pg (ref 26.0–34.0)
MCHC: 34.2 g/dL (ref 30.0–36.0)
MCV: 85.7 fL (ref 80.0–100.0)
Monocytes Absolute: 2.1 10*3/uL — ABNORMAL HIGH (ref 0.1–1.0)
Monocytes Relative: 11 %
Neutro Abs: 15.3 10*3/uL — ABNORMAL HIGH (ref 1.7–7.7)
Neutrophils Relative %: 79 %
Platelets: 230 10*3/uL (ref 150–400)
RBC: 6.03 MIL/uL — ABNORMAL HIGH (ref 4.22–5.81)
RDW: 12.6 % (ref 11.5–15.5)
WBC: 19.4 10*3/uL — ABNORMAL HIGH (ref 4.0–10.5)
nRBC: 0 % (ref 0.0–0.2)

## 2021-01-17 LAB — RESP PANEL BY RT-PCR (FLU A&B, COVID) ARPGX2
Influenza A by PCR: NEGATIVE
Influenza B by PCR: NEGATIVE
SARS Coronavirus 2 by RT PCR: NEGATIVE

## 2021-01-17 MED ORDER — IOHEXOL 300 MG/ML  SOLN
75.0000 mL | Freq: Once | INTRAMUSCULAR | Status: AC | PRN
  Administered 2021-01-17: 75 mL via INTRAVENOUS

## 2021-01-17 MED ORDER — SODIUM CHLORIDE 0.9 % IV BOLUS
1000.0000 mL | Freq: Once | INTRAVENOUS | Status: AC
  Administered 2021-01-17: 1000 mL via INTRAVENOUS

## 2021-01-17 MED ORDER — ACETAMINOPHEN 325 MG PO TABS
650.0000 mg | ORAL_TABLET | Freq: Once | ORAL | Status: AC
  Administered 2021-01-17: 650 mg via ORAL
  Filled 2021-01-17: qty 2

## 2021-01-17 MED ORDER — AMOXICILLIN-POT CLAVULANATE 875-125 MG PO TABS: 1.0000 | ORAL_TABLET | Freq: Two times a day (BID) | ORAL | 0 refills | Status: DC

## 2021-01-17 MED ORDER — METRONIDAZOLE 500 MG/100ML IV SOLN
500.0000 mg | Freq: Once | INTRAVENOUS | Status: AC
  Administered 2021-01-17: 500 mg via INTRAVENOUS
  Filled 2021-01-17: qty 100

## 2021-01-17 MED ORDER — DEXAMETHASONE SODIUM PHOSPHATE 10 MG/ML IJ SOLN
10.0000 mg | Freq: Once | INTRAMUSCULAR | Status: AC
  Administered 2021-01-17: 10 mg via INTRAVENOUS
  Filled 2021-01-17: qty 1

## 2021-01-17 MED ORDER — KETOROLAC TROMETHAMINE 30 MG/ML IJ SOLN
30.0000 mg | Freq: Once | INTRAMUSCULAR | Status: AC
  Administered 2021-01-17: 30 mg via INTRAVENOUS
  Filled 2021-01-17: qty 1

## 2021-01-17 MED ORDER — SODIUM CHLORIDE 0.9 % IV SOLN
2.0000 g | Freq: Once | INTRAVENOUS | Status: AC
  Administered 2021-01-17: 2 g via INTRAVENOUS
  Filled 2021-01-17: qty 20

## 2021-01-17 NOTE — ED Triage Notes (Signed)
Pt complains of chills sore throat and generalized body aches. Pt stated that he went t to the UC yesterday and was given oral penicillan. Pt was told to go to the ED if symptoms did not get better in the morning.   Pt has anti-phospholipid syndrome and is on eliquis

## 2021-01-17 NOTE — ED Notes (Signed)
States seen in am for sore throat but not feeling better.  Has body chills and aches. Denies SOB or cough

## 2021-01-17 NOTE — ED Provider Notes (Signed)
[facility] EMERGENCY DEPT Provider Note   CSN: [id number] Arrival date & time: 01/17/21  1320     History Chief Complaint  Patient presents with   Sore Throat    Vincent Warner is a 40 y.o. male.  The history is provided by the patient.  Sore Throat This is a new problem. The current episode started more than 2 days ago (3 days ago). The problem occurs constantly. The problem has been gradually worsening. Pertinent negatives include no chest pain, no abdominal pain, no headaches and no shortness of breath. The symptoms are aggravated by swallowing. Nothing relieves the symptoms. Treatments tried: penicillin. The treatment provided no relief.       Patient Active Problem List   Diagnosis Date Noted   Antiphospholipid antibody syndrome (HCC) 01/17/2021   No family history on file.     Home Medications Prior to Admission medications   Not on File    Allergies    Patient has no allergy information on record.  Review of Systems   Review of Systems  Constitutional:  Negative for chills and fever.  HENT:  Positive for sore throat. Negative for ear pain.   Eyes:  Negative for pain and visual disturbance.  Respiratory:  Negative for cough and shortness of breath.   Cardiovascular:  Negative for chest pain and palpitations.  Gastrointestinal:  Negative for abdominal pain and vomiting.  Genitourinary:  Negative for dysuria and hematuria.  Musculoskeletal:  Negative for arthralgias and back pain.  Skin:  Negative for color change and rash.  Neurological:  Negative for seizures, syncope and headaches.  All other systems reviewed and are negative.  Physical Exam Updated Vital Signs BP (!) 144/93 (BP Location: Right Arm)   Pulse (!) 109   Temp 98.3 F (36.8 C) (Oral)   Resp 19   Ht 6\' 6"  (1.981 m)   Wt 113.4 kg   SpO2 100%   BMI 28.89 kg/m   Physical Exam Vitals and nursing note reviewed.  Constitutional:      Appearance: Normal appearance.  HENT:      Head: Normocephalic and atraumatic.     Mouth/Throat:     Mouth: Mucous membranes are moist.      Comments: Uvula is midline.  Pharynx is erythematous.  There is palatal elevation, tonsillar enlargement, and exudate on the right side which is asymmetric with respect to the left  Patient is speaking, handling secretions Eyes:     Conjunctiva/sclera: Conjunctivae normal.  Pulmonary:     Effort: Pulmonary effort is normal. No respiratory distress.  Musculoskeletal:        General: No deformity. Normal range of motion.     Cervical back: Normal range of motion.  Lymphadenopathy:     Cervical: Cervical adenopathy present.  Skin:    General: Skin is warm and dry.  Neurological:     General: No focal deficit present.     Mental Status: He is alert and oriented to person, place, and time. Mental status is at baseline.  Psychiatric:        Mood and Affect: Mood normal.    ED Results / Procedures / Treatments   Labs (all labs ordered are listed, but only abnormal results are displayed) Labs Reviewed  CBC WITH DIFFERENTIAL/PLATELET - Abnormal; Notable for the following components:      Result Value   WBC 19.4 (*)    RBC 6.03 (*)    Hemoglobin 17.7 (*)    Neutro Abs 15.3 (*)  Monocytes Absolute 2.1 (*)    Abs Immature Granulocytes 0.17 (*)    All other components within normal limits  COMPREHENSIVE METABOLIC PANEL - Abnormal; Notable for the following components:   Sodium 133 (*)    Chloride 97 (*)    Total Protein 8.5 (*)    All other components within normal limits  RESP PANEL BY RT-PCR (FLU A&B, COVID) ARPGX2    EKG None  Radiology CT Soft Tissue Neck W Contrast  Result Date: 01/17/2021 CLINICAL DATA:  Chills, sore throat EXAM: CT NECK WITH CONTRAST TECHNIQUE: Multidetector CT imaging of the neck was performed using the standard protocol following the bolus administration of intravenous contrast. CONTRAST:  87mL OMNIPAQUE IOHEXOL 300 MG/ML  SOLN COMPARISON:  None.  FINDINGS: Pharynx and larynx: There is enlargement of the palatine tonsils with mildly heterogeneous enhancement. No peritonsillar abscess. Airway is narrowed but patent. Larynx is unremarkable. Salivary glands: Parotid and submandibular glands are unremarkable. Thyroid: Normal. Lymph nodes: There is likely reactive upper cervical lymphadenopathy. For example, right level 2 node measuring 2.2 cm. Vascular: Major neck vessels are patent. Limited intracranial: No abnormal enhancement. Visualized orbits: Unremarkable. Mastoids and visualized paranasal sinuses: Mild mucosal thickening. Mastoid air cells are clear. Skeleton: No acute or significant osseous abnormality. Upper chest: Included upper lungs are clear. Other: None. IMPRESSION: Pharyngitis/tonsillitis without evidence of peritonsillar abscess. Electronically Signed   By: Guadlupe Spanish M.D.   On: 01/17/2021 15:29    Procedures Procedures   Medications Ordered in ED Medications  metroNIDAZOLE (FLAGYL) IVPB 500 mg (has no administration in time range)  cefTRIAXone (ROCEPHIN) 2 g in sodium chloride 0.9 % 100 mL IVPB (2 g Intravenous New Bag/Given 01/17/21 1435)  ketorolac (TORADOL) 30 MG/ML injection 30 mg (has no administration in time range)  acetaminophen (TYLENOL) tablet 650 mg (has no administration in time range)  sodium chloride 0.9 % bolus 1,000 mL (1,000 mLs Intravenous New Bag/Given 01/17/21 1430)  dexamethasone (DECADRON) injection 10 mg (10 mg Intravenous Given 01/17/21 1432)    ED Course  I have reviewed the triage vital signs and the nursing notes.  Pertinent labs & imaging results that were available during my care of the patient were reviewed by me and considered in my medical decision making (see chart for details).    MDM Rules/Calculators/A&P                          Vincent Warner presents with sore throat and fever.  It appears that he may have a peritonsillar abscess.  He will be given antibiotics, steroids, and IV  fluids.  Labs and CT scan will be performed.  CT scan is not consistent with a deep soft tissue abscess or peritonsillar abscess.  He will be discharged home with a prescription for Augmentin.  He should stop his penicillin.  He was given information about ENT follow-up. Final Clinical Impression(s) / ED Diagnoses Final diagnoses:  Peritonsillar cellulitis    Rx / DC Orders ED Discharge Orders          Ordered    amoxicillin-clavulanate (AUGMENTIN) 875-125 MG tablet  Every 12 hours        01/17/21 1535             Koleen Distance, MD 01/17/21 1536

## 2021-02-23 ENCOUNTER — Other Ambulatory Visit: Payer: Self-pay

## 2021-02-23 ENCOUNTER — Emergency Department (HOSPITAL_BASED_OUTPATIENT_CLINIC_OR_DEPARTMENT_OTHER)
Admission: EM | Admit: 2021-02-23 | Discharge: 2021-02-23 | Disposition: A | Discharge status: Home / Self Care | Attending: Emergency Medicine | Admitting: Emergency Medicine

## 2021-02-23 ENCOUNTER — Encounter (HOSPITAL_BASED_OUTPATIENT_CLINIC_OR_DEPARTMENT_OTHER): Payer: Self-pay | Admitting: Emergency Medicine

## 2021-02-23 DIAGNOSIS — Z7901 Long term (current) use of anticoagulants: Secondary | ICD-10-CM | POA: Insufficient documentation

## 2021-02-23 DIAGNOSIS — Z23 Encounter for immunization: Secondary | ICD-10-CM | POA: Insufficient documentation

## 2021-02-23 DIAGNOSIS — M7989 Other specified soft tissue disorders: Secondary | ICD-10-CM | POA: Diagnosis present

## 2021-02-23 DIAGNOSIS — L03011 Cellulitis of right finger: Secondary | ICD-10-CM | POA: Insufficient documentation

## 2021-02-23 MED ORDER — AMOXICILLIN-POT CLAVULANATE 875-125 MG PO TABS
1.0000 | ORAL_TABLET | Freq: Once | ORAL | Status: AC
  Administered 2021-02-23: 1 via ORAL
  Filled 2021-02-23: qty 1

## 2021-02-23 MED ORDER — LIDOCAINE-EPINEPHRINE (PF) 2 %-1:200000 IJ SOLN
20.0000 mL | Freq: Once | INTRAMUSCULAR | Status: AC
  Administered 2021-02-23: 20 mL
  Filled 2021-02-23: qty 20

## 2021-02-23 MED ORDER — AMOXICILLIN-POT CLAVULANATE 875-125 MG PO TABS: 1.0000 | ORAL_TABLET | Freq: Two times a day (BID) | ORAL | 0 refills | Status: DC

## 2021-02-23 MED ORDER — TETANUS-DIPHTH-ACELL PERTUSSIS 5-2.5-18.5 LF-MCG/0.5 IM SUSY
0.5000 mL | PREFILLED_SYRINGE | Freq: Once | INTRAMUSCULAR | Status: AC
  Administered 2021-02-23: 0.5 mL via INTRAMUSCULAR
  Filled 2021-02-23: qty 0.5

## 2021-02-23 NOTE — ED Provider Notes (Signed)
[facility] EMERGENCY DEPT Provider Note   CSN: [id number] Arrival date & time: 02/23/21  1134     History Chief Complaint  Patient presents with   Hand Pain    Vincent Warner is a 40 y.o. male.  Patient c/o swelling, pain and drainage to right thumb. Symptoms in past couple days - acute onset, moderate, constant, persistent. Patient drained small amount pus w sharp tweezer edge. Denies recent injury to thumb. No hx same pain. Does not bite nails. No fever or chills. Last tetanus unknown.  The history is provided by the patient.  Hand Pain      Past Medical History:  Diagnosis Date   Antiphospholipid antibody syndrome [facility])     Patient Active Problem List   Diagnosis Date Noted   Antiphospholipid antibody syndrome (HCC) 01/17/2021    History reviewed. No pertinent surgical history.     History reviewed. No pertinent family history.     Home Medications Prior to Admission medications   Medication Sig Start Date End Date Taking? Authorizing Provider  amoxicillin-clavulanate (AUGMENTIN) 875-125 MG tablet Take 1 tablet by mouth every 12 (twelve) hours. 01/17/21   Koleen Distance, MD  clomiPHENE (CLOMID) 50 MG tablet Take by mouth. 09/26/20   [provider]  ELIQUIS 5 MG TABS tablet Take 1 tablet by mouth 2 (two) times daily. 10/25/20   [provider]  human chorionic gonadotropin (PREGNYL/NOVAREL) 10000 units injection Inject into the muscle. 10/14/20   [provider]  testosterone cypionate (DEPOTESTOSTERONE CYPIONATE) 200 MG/ML injection Inject into the muscle. 10/20/20   [provider]    Allergies    Patient has no allergy information on record.  Review of Systems   Review of Systems  Constitutional:  Negative for chills and fever.  Musculoskeletal:        Right thumb infection/paronychia  Skin:  Negative for rash.  Neurological:  Negative for numbness.   Physical Exam Updated Vital Signs BP 113/81   Pulse  75   Temp 97.9 F (36.6 C) (Oral)   Resp 16   Ht 1.981 m (6\' 6" )   Wt 108.9 kg   SpO2 98%   BMI 27.73 kg/m   Physical Exam Vitals and nursing note reviewed.  Constitutional:      Appearance: Normal appearance. He is well-developed.  HENT:     Nose: Nose normal.     Mouth/Throat:     Mouth: Mucous membranes are moist.  Eyes:     General: No scleral icterus.    Conjunctiva/sclera: Conjunctivae normal.  Neck:     Trachea: No tracheal deviation.  Cardiovascular:     Rate and Rhythm: Normal rate.     Pulses: Normal pulses.  Pulmonary:     Effort: Pulmonary effort is normal. No accessory muscle usage or respiratory distress.  Genitourinary:    Comments: No cva tenderness. Musculoskeletal:     Cervical back: Neck supple.     Comments: Paronychia right thumb, with mild surrounding erythema. Normal cap refill distally. No pain w passive rom thumb, no tenosynovitis.   Skin:    General: Skin is warm and dry.     Findings: No rash.  Neurological:     Mental Status: He is alert.     Comments: Alert, speech clear. Thumb nvi.   Psychiatric:        Mood and Affect: Mood normal.    ED Results / Procedures / Treatments   Labs (all labs ordered are listed, but only  abnormal results are displayed) Labs Reviewed - No data to display  EKG None  Radiology No results found.  Procedures .Marland KitchenIncision and Drainage  Date/Time: 02/23/2021 1:23 PM Performed by: Cathren Laine, MD Authorized by: Cathren Laine, MD   Consent:    Consent given by:  Patient Location:    Location:  Upper extremity   Upper extremity location:  Finger   Finger location:  R thumb Pre-procedure details:    Skin preparation:  Chlorhexidine with alcohol Anesthesia:    Anesthesia method:  Nerve block   Block location:  Right thumb digit block, total 5 ccs   Block anesthetic:  Lidocaine 2% w/o epi   Block technique:  Digit block   Block outcome:  Anesthesia achieved Procedure type:    Complexity:   Complex Procedure details:    Incision depth:  Subcutaneous   Wound management:  Probed and deloculated and irrigated with saline   Drainage:  Purulent   Drainage amount:  Moderate   Wound treatment:  Wound left open   Packing materials:  None Post-procedure details:    Procedure completion:  Tolerated well, no immediate complications   Medications Ordered in ED Medications  lidocaine-EPINEPHrine (XYLOCAINE W/EPI) 2 %-1:200000 (PF) injection 20 mL (20 mLs Infiltration Given by Other 02/23/21 1321)    ED Course  I have reviewed the triage vital signs and the nursing notes.  Pertinent labs & imaging results that were available during my care of the patient were reviewed by me and considered in my medical decision making (see chart for details).    MDM Rules/Calculators/A&P                           Tetanus im.   I and D of paronychia after digit block.   Reviewed nursing notes and prior charts for additional history.   W mild surrounding erythema, will give abx rx.   Post digit block and I and D. Cap refill normally, finger normal color/warmth.   Return precautions provided.    Final Clinical Impression(s) / ED Diagnoses Final diagnoses:  None    Rx / DC Orders ED Discharge Orders     None        Cathren Laine, MD 02/23/21 1325

## 2021-02-23 NOTE — ED Triage Notes (Signed)
Patient has a paronychia to the right thumb. Patient reports he tried to drain the finger without much success. Patient reports he got some yellow puss from the area.

## 2021-02-23 NOTE — Discharge Instructions (Addendum)
It was our pleasure to provide your ER care today - we hope that you feel better.  Keep area very clean. Take antibiotic as prescribed.   Follow up with primary care doctor in 1 week if symptoms fail to improve/resolve.  Return to ER if worse, new symptoms, severe pain, spreading redness, increased swelling, fevers, or other concern.

## 2021-04-14 ENCOUNTER — Encounter: Payer: Self-pay | Admitting: Endocrinology

## 2021-04-27 ENCOUNTER — Emergency Department (HOSPITAL_BASED_OUTPATIENT_CLINIC_OR_DEPARTMENT_OTHER): Admit: 2021-04-27

## 2021-04-27 ENCOUNTER — Emergency Department (HOSPITAL_BASED_OUTPATIENT_CLINIC_OR_DEPARTMENT_OTHER)
Admission: EM | Admit: 2021-04-27 | Discharge: 2021-04-27 | Disposition: A | Discharge status: Home / Self Care | Attending: Emergency Medicine | Admitting: Emergency Medicine

## 2021-04-27 ENCOUNTER — Other Ambulatory Visit: Payer: Self-pay

## 2021-04-27 ENCOUNTER — Encounter (HOSPITAL_BASED_OUTPATIENT_CLINIC_OR_DEPARTMENT_OTHER): Payer: Self-pay | Admitting: *Deleted

## 2021-04-27 DIAGNOSIS — Z7901 Long term (current) use of anticoagulants: Secondary | ICD-10-CM | POA: Insufficient documentation

## 2021-04-27 DIAGNOSIS — N50819 Testicular pain, unspecified: Secondary | ICD-10-CM

## 2021-04-27 DIAGNOSIS — N50812 Left testicular pain: Secondary | ICD-10-CM | POA: Insufficient documentation

## 2021-04-27 HISTORY — DX: Acute embolism and thrombosis of unspecified deep veins of unspecified lower extremity: I82.409

## 2021-04-27 MED ORDER — KETOROLAC TROMETHAMINE 60 MG/2ML IM SOLN
15.0000 mg | Freq: Once | INTRAMUSCULAR | Status: AC
  Administered 2021-04-27: 15 mg via INTRAMUSCULAR
  Filled 2021-04-27: qty 2

## 2021-04-27 NOTE — ED Provider Notes (Signed)
[facility] EMERGENCY DEPT Provider Note   CSN: [id number] Arrival date & time: 04/27/21  0107     History Chief Complaint  Patient presents with   Other    Testicle pain     Vincent Warner is a 40 y.o. male.  40 yo M with a chief complaints of testicular pain.  This been off and on for months.  Usually worse in the left than the right testicle.  Typically happens when he is doing some heavy lifting.  Had an event today where he was doing some yard work and lifting some heavy logs for a neighbor and after which she developed some worsening left testicle pain.  States that it was very swollen and actually had pain to the right testicle as well.  Denies trauma to the area.  He applied ice with some improvement.  He did see a urologist recently though was for a different problem and did not have his testicles evaluated.    The history is provided by the patient.  Illness Severity:  Moderate Onset quality:  Gradual Duration:  2 months Timing:  Intermittent Progression:  Waxing and waning Chronicity:  New Associated symptoms: no abdominal pain, no chest pain, no congestion, no diarrhea, no fever, no headaches, no myalgias, no rash, no shortness of breath and no vomiting       Past Medical History:  Diagnosis Date   Antiphospholipid antibody syndrome (HCC)    DVT (deep venous thrombosis) (HCC)    Traumatic brain injury 2014    Patient Active Problem List   Diagnosis Date Noted   Antiphospholipid antibody syndrome (HCC) 01/17/2021   HGHG (hypogonadotropic hypogonadism) (HCC) 03/18/2020   Traumatic brain injury with loss of consciousness (HCC) 07/19/2018   H/O secondary hypogonadism 07/19/2018   Hx of deep venous thrombosis 07/19/2018   Acute bilateral low back pain without sciatica 01/07/2017    Past Surgical History:  Procedure Laterality Date   HERNIA REPAIR         No family history on file.  Tobacco Use   Smokeless tobacco: Never    Home  Medications Prior to Admission medications   Medication Sig Start Date End Date Taking? Authorizing Provider  apixaban (ELIQUIS) 5 MG TABS tablet Take 5 mg by mouth 2 (two) times daily.    [provider]  clomiPHENE (CLOMID) 50 MG tablet Take 1/2 tablet 3 times a week. 09/02/20   Reather Littler, MD  clomiPHENE (CLOMID) 50 MG tablet Take by mouth. 09/26/20   [provider]  ELIQUIS 5 MG TABS tablet Take 1 tablet by mouth 2 (two) times daily. 10/25/20   [provider]  human chorionic gonadotropin (PREGNYL/NOVAREL) 10000 units injection Inject 0.5 mLs (500 Units total) into the muscle 3 (three) times a week. 08/09/20   Shamleffer, Konrad Dolores, MD  human chorionic gonadotropin (PREGNYL/NOVAREL) 10000 units injection Inject into the muscle. 10/14/20   [provider]  INSULIN SYRINGE .5CC/29G (ADVOCATE INSULIN SYRINGE) 29G X 1/2" 0.5 ML MISC Total 6 x a week 09/13/20   Reather Littler, MD  testosterone cypionate (DEPOTESTOSTERONE CYPIONATE) 200 MG/ML injection Inject 0.2 mLs (40 mg total) into the muscle 3 (three) times a week. 09/02/20   Reather Littler, MD  testosterone cypionate (DEPOTESTOSTERONE CYPIONATE) 200 MG/ML injection Inject into the muscle. 10/20/20   [provider]    Allergies    Tegaderm ag mesh [silver]  Review of Systems   Review of Systems  Constitutional:  Negative for chills and fever.  HENT:  Negative for congestion and facial swelling.   Eyes:  Negative for discharge and visual disturbance.  Respiratory:  Negative for shortness of breath.   Cardiovascular:  Negative for chest pain and palpitations.  Gastrointestinal:  Negative for abdominal pain, diarrhea and vomiting.  Genitourinary:  Positive for testicular pain.  Musculoskeletal:  Negative for arthralgias and myalgias.  Skin:  Negative for color change and rash.  Neurological:  Negative for tremors, syncope and headaches.  Psychiatric/Behavioral:  Negative for confusion and dysphoric  mood.    Physical Exam Updated Vital Signs BP 127/90 (BP Location: Right Arm)   Pulse (!) 102   Resp 20   Ht 6\' 6"  (1.981 m)   Wt 108.9 kg   SpO2 100%   BMI 27.73 kg/m   Physical Exam Vitals and nursing note reviewed.  Constitutional:      Appearance: He is well-developed.  HENT:     Head: Normocephalic and atraumatic.  Eyes:     Pupils: Pupils are equal, round, and reactive to light.  Neck:     Vascular: No JVD.  Cardiovascular:     Rate and Rhythm: Normal rate and regular rhythm.     Heart sounds: No murmur heard.   No friction rub. No gallop.  Pulmonary:     Effort: No respiratory distress.     Breath sounds: No wheezing.  Abdominal:     General: There is no distension.     Tenderness: There is no abdominal tenderness. There is no guarding or rebound.  Genitourinary:    Comments: Mild tenderness to the testicles bilaterally.  Normal lie.  Cremasteric reflex intact bilaterally.  Mild fullness of the epididymis bilaterally.  Pain with insertion of the finger into the left inguinal canal.  No obvious bulging or masses.  No significant pain along the attachment of the adductor muscle to the pubic bone. Musculoskeletal:        General: Normal range of motion.     Cervical back: Normal range of motion and neck supple.  Skin:    Coloration: Skin is not pale.     Findings: No rash.  Neurological:     Mental Status: He is alert and oriented to person, place, and time.  Psychiatric:        Behavior: Behavior normal.    ED Results / Procedures / Treatments   Labs (all labs ordered are listed, but only abnormal results are displayed) Labs Reviewed - No data to display  EKG None  Radiology No results found.  Procedures Procedures   Medications Ordered in ED Medications  ketorolac (TORADOL) injection 15 mg (15 mg Intramuscular Given 04/27/21 0148)    ED Course  I have reviewed the triage vital signs and the nursing notes.  Pertinent labs & imaging results that  were available during my care of the patient were reviewed by me and considered in my medical decision making (see chart for details).    MDM Rules/Calculators/A&P                           40 yo M with a chief complaints of testicular pain.  This been off and on for a couple months.  Usually occurs after some heavy lifting.  Unfortunately I do not have ultrasound here today.  I did offer to emergently transfer him to 45 to evaluate for torsion though my clinical suspicion for that is quite low with the duration of his symptoms  and no abnormal finding on exam and the fact that his symptoms have improved since onset this evening.  The patient would prefer to have the ultrasound performed tomorrow.  We will put in an order for that.  Suggest that he call urology on Monday to be seen in the office.  1:53 AM:  I have discussed the diagnosis/risks/treatment options with the patient and believe the pt to be eligible for discharge home to follow-up with Urology. We also discussed returning to the ED immediately if new or worsening sx occur. We discussed the sx which are most concerning (e.g., sudden worsening pain, fever, inability to tolerate by mouth) that necessitate immediate return. Medications administered to the patient during their visit and any new prescriptions provided to the patient are listed below.  Medications given during this visit Medications  ketorolac (TORADOL) injection 15 mg (15 mg Intramuscular Given 04/27/21 0148)     The patient appears reasonably screen and/or stabilized for discharge and I doubt any other medical condition or other [facility] requiring further screening, evaluation, or treatment in the ED at this time prior to discharge.   Final Clinical Impression(s) / ED Diagnoses Final diagnoses:  Testicular pain, left    Rx / DC Orders ED Discharge Orders          Ordered    US SCROTUM DOPPLER        04/27/21 0139             Melene Plan, DO 04/27/21  0154

## 2021-04-27 NOTE — ED Triage Notes (Addendum)
Pt c/o bilateral testicle pain that started today. Has been doing extensive yard work today. States this has happened before and ongoing for the past few months.  Has placed ice and ibuprofen without any relief. Has recently seen a urologist for difficulty urinating.

## 2021-04-27 NOTE — Discharge Instructions (Signed)
Tight fitting underwear.  Follow up with a urologist.  Return for worsening pain.   Take 4 over the counter ibuprofen tablets 3 times a day or 2 over-the-counter naproxen tablets twice a day for pain. Also take tylenol 1000mg (2 extra strength) four times a day.

## 2021-06-14 ENCOUNTER — Encounter (HOSPITAL_BASED_OUTPATIENT_CLINIC_OR_DEPARTMENT_OTHER): Payer: Self-pay

## 2021-06-14 ENCOUNTER — Emergency Department (HOSPITAL_BASED_OUTPATIENT_CLINIC_OR_DEPARTMENT_OTHER)
Admission: EM | Admit: 2021-06-14 | Discharge: 2021-06-14 | Disposition: A | Discharge status: Home / Self Care | Attending: Emergency Medicine | Admitting: Emergency Medicine

## 2021-06-14 ENCOUNTER — Other Ambulatory Visit: Payer: Self-pay

## 2021-06-14 DIAGNOSIS — Z7901 Long term (current) use of anticoagulants: Secondary | ICD-10-CM | POA: Insufficient documentation

## 2021-06-14 DIAGNOSIS — Z20822 Contact with and (suspected) exposure to covid-19: Secondary | ICD-10-CM | POA: Insufficient documentation

## 2021-06-14 DIAGNOSIS — J039 Acute tonsillitis, unspecified: Secondary | ICD-10-CM

## 2021-06-14 DIAGNOSIS — J029 Acute pharyngitis, unspecified: Secondary | ICD-10-CM | POA: Diagnosis present

## 2021-06-14 LAB — GROUP A STREP BY PCR: Group A Strep by PCR: NOT DETECTED

## 2021-06-14 LAB — RESP PANEL BY RT-PCR (FLU A&B, COVID) ARPGX2
Influenza A by PCR: NEGATIVE
Influenza B by PCR: NEGATIVE
SARS Coronavirus 2 by RT PCR: NEGATIVE

## 2021-06-14 MED ORDER — CLINDAMYCIN HCL 300 MG PO CAPS: 300.0000 mg | ORAL_CAPSULE | Freq: Four times a day (QID) | ORAL | 0 refills | Status: AC

## 2021-06-14 MED ORDER — DEXAMETHASONE SODIUM PHOSPHATE 10 MG/ML IJ SOLN
10.0000 mg | Freq: Once | INTRAMUSCULAR | Status: AC
  Administered 2021-06-14: 10 mg via INTRAVENOUS
  Filled 2021-06-14: qty 1

## 2021-06-14 MED ORDER — SODIUM CHLORIDE 0.9 % IV BOLUS: 1000.0000 mL | Freq: Once | INTRAVENOUS | Status: DC

## 2021-06-14 MED ORDER — CLINDAMYCIN PHOSPHATE 600 MG/50ML IV SOLN
600.0000 mg | Freq: Once | INTRAVENOUS | Status: AC
  Administered 2021-06-14: 600 mg via INTRAVENOUS
  Filled 2021-06-14: qty 50

## 2021-06-14 NOTE — ED Triage Notes (Signed)
He tells me that he's had a sore throat and body aches x 2 days. He states he has had a few similar episodes during which he is not shown to have strep; and he eventually requires "IV antibiotics to clear it up". He tells me the most recent of these episodes was in June of this year. He is in no distress and tells me he is able to swallow.

## 2021-06-14 NOTE — Discharge Instructions (Signed)
You were given a prescription for antibiotics. Please take the antibiotic prescription fully.   Please follow up with the ear, nose and throat doctor for further evaluation of your recurrent throat infections   Please return to the emergency department for any new or worsening symptoms.

## 2021-06-14 NOTE — ED Provider Notes (Signed)
[facility] EMERGENCY DEPT Provider Note   CSN: [id number] Arrival date & time: 06/14/21  9371     History Chief Complaint  Patient presents with   Sore Throat    Vincent Warner is a 40 y.o. male.  HPI  40 year old male with a history of antiphospholipid antibody syndrome, DVT, TBI, who presents to the emergency department today for evaluation of sore throat.  Pain constant and severe in nature. Patient has a history of pharyngeal cellulitis and has required IV antibiotics in the past.  He states that about a week ago he started having a sore throat, body aches chills and fevers.  He was seen in urgent care at that time and had a negative COVID/strep and flu test.  He was given 1 dose of ceftriaxone at that time.  He was not discharged on antibiotics at that time.  Since then he states his symptoms have persisted and worsened despite over-the-counter medications.  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  Tobacco Use   Smokeless tobacco: Never    Home Medications Prior to Admission medications   Medication Sig Start Date End Date Taking? Authorizing Provider  clindamycin (CLEOCIN) 300 MG capsule Take 1 capsule (300 mg total) by mouth 4 (four) times daily for 7 days. 06/14/21 06/21/21 Yes Thora Scherman S, PA-C   apixaban (ELIQUIS) 5 MG TABS tablet Take 5 mg by mouth 2 (two) times daily.    [provider]  clomiPHENE (CLOMID) 50 MG tablet Take 1/2 tablet 3 times a week. 09/02/20   Reather Littler, MD  clomiPHENE (CLOMID) 50 MG tablet Take by mouth. 09/26/20   [provider]  ELIQUIS 5 MG TABS tablet Take 1 tablet by mouth 2 (two) times daily. 10/25/20   [provider]  human chorionic gonadotropin (PREGNYL/NOVAREL) 10000 units injection Inject 0.5 mLs (500 Units total) into the muscle 3 (three) times a week. 08/09/20   Shamleffer, Konrad Dolores, MD  human chorionic gonadotropin (PREGNYL/NOVAREL) 10000 units injection Inject into the muscle. 10/14/20   [provider]  INSULIN SYRINGE .5CC/29G (ADVOCATE INSULIN SYRINGE) 29G X 1/2" 0.5 ML MISC Total 6 x a week 09/13/20   Reather Littler, MD  testosterone cypionate (DEPOTESTOSTERONE CYPIONATE) 200 MG/ML injection Inject 0.2 mLs (40 mg total) into the muscle 3 (three) times a week. 09/02/20   Reather Littler, MD  testosterone cypionate (DEPOTESTOSTERONE CYPIONATE) 200 MG/ML injection Inject into the muscle. 10/20/20   [provider]    Allergies    Tegaderm ag mesh [silver]  Review of Systems   Review of Systems  Constitutional:  Positive for chills and fever.  HENT:  Positive for sore throat.   Eyes:  Negative for visual disturbance.  Respiratory:  Negative for cough and shortness of breath.   Cardiovascular:  Negative for chest  pain.  Gastrointestinal:  Negative for constipation, diarrhea, nausea and vomiting.  Genitourinary:  Negative for flank pain.  Musculoskeletal:  Positive for myalgias.  Skin:  Negative for color change and rash.  Neurological:  Negative for syncope.  All other systems reviewed and are negative.  Physical Exam Updated Vital Signs BP 125/78 (BP Location: Right Arm)   Pulse 63   Temp 98.4 F (36.9 C) (Oral)   Resp 16   SpO2 97%   Physical Exam Constitutional:      General: He is not in  acute distress.    Appearance: He is well-developed.  HENT:     Mouth/Throat:     Mouth: Mucous membranes are moist.     Pharynx: Pharyngeal swelling and posterior oropharyngeal erythema present. No oropharyngeal exudate or uvula swelling.     Tonsils: No tonsillar abscesses. 1+ on the right. 1+ on the left.     Comments: No trismus. No evidence of deep space infection.  Eyes:     Conjunctiva/sclera: Conjunctivae normal.  Cardiovascular:     Rate and Rhythm: Normal rate and regular rhythm.  Pulmonary:     Effort: Pulmonary effort is normal.     Breath sounds: Normal breath sounds.  Abdominal:     Palpations: Abdomen is soft.     Tenderness: There is no abdominal tenderness.  Lymphadenopathy:     Cervical: Cervical adenopathy present.  Skin:    General: Skin is warm and dry.  Neurological:     Mental Status: He is alert and oriented to person, place, and time.    ED Results / Procedures / Treatments   Labs (all labs ordered are listed, but only abnormal results are displayed) Labs Reviewed  RESP PANEL BY RT-PCR (FLU A&B, COVID) ARPGX2  GROUP A STREP BY PCR    EKG None  Radiology No results found.  Procedures Procedures   Medications Ordered in ED Medications  clindamycin (CLEOCIN) IVPB 600 mg (600 mg Intravenous New Bag/Given 06/14/21 1414)  dexamethasone (DECADRON) injection 10 mg (has no administration in time range)  sodium chloride 0.9 % bolus 1,000 mL (has no administration in time range)    ED Course  I have reviewed the triage vital signs and the nursing notes.  Pertinent labs & imaging results that were available during my care of the patient were reviewed by me and considered in my medical decision making (see chart for details).    MDM Rules/Calculators/A&P                          40 year old male presenting the emergency department today for evaluation of a sore throat.  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  Is immunosuppressed.  Has had fevers and chills at home.  His COVID/flu and strep test are negative here in the emergency department.  He does not have any evidence clinically of PTA or retropharyngeal abscess.  Given that his history will give him a dose of clindamycin, Decadron in the emergency department and treated with clindamycin.  I will also give him a referral to ENT for further evaluation of his recurrent symptoms.  He voices understanding of plan and reasons to return.  All Questions answered.  Patient stable for discharge.   Final Clinical Impression(s) / ED Diagnoses Final diagnoses:  Tonsillitis    Rx / DC Orders ED Discharge Orders          Ordered  clindamycin (CLEOCIN) 300 MG capsule  4 times daily        06/14/21 [address] Oluwadarasimi Favor S, PA-C 06/14/21 1427    Pricilla Loveless, MD 06/15/21 [phone]

## 2021-06-21 ENCOUNTER — Encounter (HOSPITAL_BASED_OUTPATIENT_CLINIC_OR_DEPARTMENT_OTHER): Payer: Self-pay | Admitting: Emergency Medicine

## 2021-06-21 ENCOUNTER — Emergency Department (HOSPITAL_BASED_OUTPATIENT_CLINIC_OR_DEPARTMENT_OTHER)
Admission: EM | Admit: 2021-06-21 | Discharge: 2021-06-21 | Disposition: A | Discharge status: Home / Self Care | Attending: Emergency Medicine | Admitting: Emergency Medicine

## 2021-06-21 ENCOUNTER — Other Ambulatory Visit: Payer: Self-pay

## 2021-06-21 DIAGNOSIS — D72829 Elevated white blood cell count, unspecified: Secondary | ICD-10-CM | POA: Insufficient documentation

## 2021-06-21 DIAGNOSIS — J029 Acute pharyngitis, unspecified: Secondary | ICD-10-CM | POA: Diagnosis not present

## 2021-06-21 DIAGNOSIS — Z20822 Contact with and (suspected) exposure to covid-19: Secondary | ICD-10-CM | POA: Insufficient documentation

## 2021-06-21 DIAGNOSIS — R509 Fever, unspecified: Secondary | ICD-10-CM | POA: Diagnosis present

## 2021-06-21 DIAGNOSIS — Z7901 Long term (current) use of anticoagulants: Secondary | ICD-10-CM | POA: Diagnosis not present

## 2021-06-21 DIAGNOSIS — R197 Diarrhea, unspecified: Secondary | ICD-10-CM | POA: Insufficient documentation

## 2021-06-21 LAB — CBC WITH DIFFERENTIAL/PLATELET
Abs Immature Granulocytes: 0.09 10*3/uL — ABNORMAL HIGH (ref 0.00–0.07)
Basophils Absolute: 0 10*3/uL (ref 0.0–0.1)
Basophils Relative: 0 %
Eosinophils Absolute: 0.1 10*3/uL (ref 0.0–0.5)
Eosinophils Relative: 0 %
HCT: 42.3 % (ref 39.0–52.0)
Hemoglobin: 14.6 g/dL (ref 13.0–17.0)
Immature Granulocytes: 1 %
Lymphocytes Relative: 9 %
Lymphs Abs: 1.4 10*3/uL (ref 0.7–4.0)
MCH: 29 pg (ref 26.0–34.0)
MCHC: 34.5 g/dL (ref 30.0–36.0)
MCV: 84.1 fL (ref 80.0–100.0)
Monocytes Absolute: 1.8 10*3/uL — ABNORMAL HIGH (ref 0.1–1.0)
Monocytes Relative: 11 %
Neutro Abs: 12.7 10*3/uL — ABNORMAL HIGH (ref 1.7–7.7)
Neutrophils Relative %: 79 %
Platelets: 209 10*3/uL (ref 150–400)
RBC: 5.03 MIL/uL (ref 4.22–5.81)
RDW: 12.1 % (ref 11.5–15.5)
WBC: 16 10*3/uL — ABNORMAL HIGH (ref 4.0–10.5)
nRBC: 0 % (ref 0.0–0.2)

## 2021-06-21 LAB — BASIC METABOLIC PANEL
Anion gap: 11 (ref 5–15)
BUN: 23 mg/dL — ABNORMAL HIGH (ref 6–20)
CO2: 22 mmol/L (ref 22–32)
Calcium: 9.1 mg/dL (ref 8.9–10.3)
Chloride: 102 mmol/L (ref 98–111)
Creatinine, Ser: 1.03 mg/dL (ref 0.61–1.24)
GFR, Estimated: >60 mL/min (ref >60)
Glucose, Bld: 123 mg/dL — ABNORMAL HIGH (ref 70–99)
Potassium: 3.9 mmol/L (ref 3.5–5.1)
Sodium: 135 mmol/L (ref 135–145)

## 2021-06-21 LAB — RESP PANEL BY RT-PCR (FLU A&B, COVID) ARPGX2
Influenza A by PCR: NEGATIVE
Influenza B by PCR: NEGATIVE
SARS Coronavirus 2 by RT PCR: NEGATIVE

## 2021-06-21 LAB — GROUP A STREP BY PCR: Group A Strep by PCR: NOT DETECTED

## 2021-06-21 MED ORDER — SODIUM CHLORIDE 0.9 % IV SOLN
1.0000 g | Freq: Once | INTRAVENOUS | Status: AC
  Administered 2021-06-21: 1 g via INTRAVENOUS
  Filled 2021-06-21: qty 10

## 2021-06-21 MED ORDER — AMOXICILLIN-POT CLAVULANATE 875-125 MG PO TABS: 1.0000 | ORAL_TABLET | Freq: Two times a day (BID) | ORAL | 0 refills | Status: DC

## 2021-06-21 MED ORDER — DEXAMETHASONE SODIUM PHOSPHATE 10 MG/ML IJ SOLN
10.0000 mg | Freq: Once | INTRAMUSCULAR | Status: AC
  Administered 2021-06-21: 10 mg via INTRAVENOUS
  Filled 2021-06-21: qty 1

## 2021-06-21 MED ORDER — SODIUM CHLORIDE 0.9 % IV BOLUS
1000.0000 mL | Freq: Once | INTRAVENOUS | Status: AC
  Administered 2021-06-21: 1000 mL via INTRAVENOUS

## 2021-06-21 NOTE — ED Triage Notes (Signed)
Pt arrives pov with c/o fever, chills, sore throat, generalized body aches with fatigue. Reports autoimmune disease and "usually need abx to get better". Pt was seen on 11/19 for same. Endorses 800 mg ibuprofen 1 hr pta.

## 2021-06-21 NOTE — ED Provider Notes (Signed)
[facility] EMERGENCY DEPT Provider Note   CSN: [id number] Arrival date & time: 06/21/21  1900     History Chief Complaint  Patient presents with   Fever    Nishanth Mccaughan Spader is a 40 y.o. male.  He has a history of antiphospholipid antibody syndrome and states he gets frequent throat infections.  He says that usually treated with IV antibiotics and IV steroids.  He was here almost a week ago for same and received treatment.  Still on clindamycin.  He did not improve like he usually does.  Continues to have on and off fevers and pain in his throat, swollen lymph node on the left side of his neck.  Has had some diarrhea that he attributes to the antibiotic.  No abdominal pain.  The history is provided by the patient.  Fever Max temp prior to arrival:  103 Temp source:  Oral Severity:  Moderate Onset quality:  Gradual Duration:  1 week Timing:  Intermittent Progression:  Unchanged Chronicity:  Recurrent Relieved by:  Acetaminophen and ibuprofen Worsened by:  Nothing Ineffective treatments: antibiotics. 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 No pertinent family history.  Tobacco Use   Smokeless tobacco: Never    Home  Medications Prior to Admission medications   Medication Sig Start Date End Date Taking? Authorizing Provider  apixaban (ELIQUIS) 5 MG TABS tablet Take 5 mg by mouth 2 (two) times daily.    [provider]  clindamycin (CLEOCIN) 300 MG capsule Take 1 capsule (300 mg total) by mouth 4 (four) times daily for 7 days. 06/14/21 06/21/21  Couture, Cortni S, PA-C  clomiPHENE (CLOMID) 50 MG tablet Take 1/2 tablet 3 times a week. 09/02/20   Reather Littler, MD  clomiPHENE (CLOMID) 50 MG tablet Take by mouth. 09/26/20   [provider]  ELIQUIS 5 MG TABS tablet Take 1 tablet by mouth 2 (two) times daily. 10/25/20   [provider]  human chorionic gonadotropin (PREGNYL/NOVAREL) 10000 units injection Inject 0.5 mLs (500 Units total) into the muscle 3 (three) times a week. 08/09/20   Shamleffer, Konrad Dolores, MD  human chorionic gonadotropin (PREGNYL/NOVAREL) 10000 units injection Inject into the muscle. 10/14/20   [provider]  INSULIN SYRINGE .5CC/29G (ADVOCATE INSULIN SYRINGE) 29G X 1/2" 0.5 ML MISC Total 6 x a week 09/13/20   Reather Littler, MD  testosterone cypionate (DEPOTESTOSTERONE CYPIONATE) 200 MG/ML injection Inject 0.2 mLs (40 mg total) into the muscle 3 (three) times a week. 09/02/20   Reather Littler, MD  testosterone cypionate (DEPOTESTOSTERONE CYPIONATE) 200 MG/ML injection Inject into the muscle. 10/20/20   [provider]    Allergies    Tegaderm ag mesh [silver]  Review of Systems  Review of Systems  Constitutional:  Positive for fever.  HENT:  Positive for sore throat.   Eyes:  Negative for visual disturbance.  Respiratory:  Negative for cough.   Cardiovascular:  Negative for chest pain.  Gastrointestinal:  Positive for diarrhea.  Genitourinary:  Negative for dysuria.  Musculoskeletal:  Positive for myalgias.  Skin:  Negative for rash.  Neurological:  Negative for headaches.   Physical Exam Updated Vital Signs BP 136/80 (BP Location: Right Arm)    Pulse 86   Temp 98.7 F (37.1 C) (Oral)   Resp 20   Ht 6\' 6"  (1.981 m)   Wt 102.1 kg   SpO2 99%   BMI 26.00 kg/m   Physical Exam Vitals and nursing note reviewed.  Constitutional:      General: He is not in acute distress.    Appearance: Normal appearance. He is well-developed.  HENT:     Head: Normocephalic and atraumatic.     Mouth/Throat:     Mouth: Mucous membranes are moist.     Pharynx: Oropharynx is clear. Posterior oropharyngeal erythema present. No oropharyngeal exudate.     Comments: Uvula midline no signs of peritonsillar abscess Eyes:     Conjunctiva/sclera: Conjunctivae normal.  Cardiovascular:     Rate and Rhythm: Normal rate and regular rhythm.     Heart sounds: No murmur heard. Pulmonary:     Effort: Pulmonary effort is normal. No respiratory distress.     Breath sounds: Normal breath sounds.  Abdominal:     Palpations: Abdomen is soft.     Tenderness: There is no abdominal tenderness.  Musculoskeletal:        General: No swelling.     Cervical back: Neck supple.  Lymphadenopathy:     Cervical: Cervical adenopathy present.  Skin:    General: Skin is warm and dry.     Capillary Refill: Capillary refill takes less than 2 seconds.  Neurological:     General: No focal deficit present.     Mental Status: He is alert.  Psychiatric:        Mood and Affect: Mood normal.    ED Results / Procedures / Treatments   Labs (all labs ordered are listed, but only abnormal results are displayed) Labs Reviewed  BASIC METABOLIC PANEL - Abnormal; Notable for the following components:      Result Value   Glucose, Bld 123 (*)    BUN 23 (*)    All other components within normal limits  CBC WITH DIFFERENTIAL/PLATELET - Abnormal; Notable for the following components:   WBC 16.0 (*)    Neutro Abs 12.7 (*)    Monocytes Absolute 1.8 (*)    Abs Immature Granulocytes 0.09 (*)    All other components within normal limits  GROUP A STREP BY PCR  RESP PANEL BY RT-PCR (FLU  A&B, COVID) ARPGX2    EKG None  Radiology No results found.  Procedures Procedures   Medications Ordered in ED Medications  sodium chloride 0.9 % bolus 1,000 mL (has no administration in time range)  cefTRIAXone (ROCEPHIN) 1 g in sodium chloride 0.9 % 100 mL IVPB (has no administration in time range)  dexamethasone (DECADRON) injection 10 mg (has no administration in time range)    ED Course  I have reviewed the triage vital signs and the nursing notes.  Pertinent labs & imaging results that were available during my care of the patient were reviewed by me and considered in my medical decision making (see chart  for details).  Clinical Course as of 06/22/21 0930  Sat Jun 21, 2021  2118 Reviewed results with patient.  Testing has been negative but does have a white count.  He would like to go back on the antibiotics that he had during the summer because it seemed to do the best.  Encouraged to work on getting set up with a rheumatologist. [MB]    Clinical Course User Index [MB] Terrilee Files, MD   MDM Rules/Calculators/A&P                          Tafari Humiston Ishibashi was evaluated in Emergency Department on 06/21/2021 for the symptoms described in the history of present illness. He was evaluated in the context of the global COVID-19 pandemic, which necessitated consideration that the patient might be at risk for infection with the SARS-CoV-2 virus that causes COVID-19. Institutional protocols and algorithms that pertain to the evaluation of patients at risk for COVID-19 are in a state of rapid change based on information released by regulatory bodies including the CDC and federal and state organizations. These policies and algorithms were followed during the patient's care in the ED.  This patient complains of on and off fevers sore throat cervical lymphadenopathy; this involves an extensive number of treatment Options and is a complaint that carries with it a high risk of  complications and Morbidity. The differential includes strep, viral pharyngitis, abscess COVID, flu  I ordered, reviewed and interpreted labs, which included CBC with elevated white count, chemistries normal with a mildly elevated glucose, strep COVID and flu negative I ordered medication fluids IV Decadron and antibiotics Previous records obtained and reviewed in epic including prior ED visits for similar presentations   After the interventions stated above, I reevaluated the patient and found patient be symptomatically improved.  No airway compromise.  Will cover with antibiotics and have placed referral in for rheumatology for him.  Return instructions discussed   Final Clinical Impression(s) / ED Diagnoses Final diagnoses:  Acute pharyngitis, unspecified etiology    Rx / DC Orders ED Discharge Orders          Ordered    amoxicillin-clavulanate (AUGMENTIN) 875-125 MG tablet  Every 12 hours,   Status:  Discontinued        06/21/21 2119    Ambulatory referral to Rheumatology        06/21/21 2119    amoxicillin-clavulanate (AUGMENTIN) 875-125 MG tablet  Every 12 hours        06/21/21 2129             Terrilee Files, MD 06/22/21 [phone]

## 2021-06-30 ENCOUNTER — Emergency Department (HOSPITAL_BASED_OUTPATIENT_CLINIC_OR_DEPARTMENT_OTHER)
Admission: EM | Admit: 2021-06-30 | Discharge: 2021-06-30 | Disposition: A | Discharge status: Home / Self Care | Attending: Emergency Medicine | Admitting: Emergency Medicine

## 2021-06-30 ENCOUNTER — Emergency Department (HOSPITAL_BASED_OUTPATIENT_CLINIC_OR_DEPARTMENT_OTHER)

## 2021-06-30 ENCOUNTER — Other Ambulatory Visit: Payer: Self-pay

## 2021-06-30 ENCOUNTER — Encounter (HOSPITAL_BASED_OUTPATIENT_CLINIC_OR_DEPARTMENT_OTHER): Payer: Self-pay | Admitting: Obstetrics and Gynecology

## 2021-06-30 DIAGNOSIS — Z7901 Long term (current) use of anticoagulants: Secondary | ICD-10-CM | POA: Insufficient documentation

## 2021-06-30 DIAGNOSIS — J101 Influenza due to other identified influenza virus with other respiratory manifestations: Secondary | ICD-10-CM

## 2021-06-30 DIAGNOSIS — R059 Cough, unspecified: Secondary | ICD-10-CM | POA: Diagnosis present

## 2021-06-30 DIAGNOSIS — Z20822 Contact with and (suspected) exposure to covid-19: Secondary | ICD-10-CM | POA: Insufficient documentation

## 2021-06-30 DIAGNOSIS — Z794 Long term (current) use of insulin: Secondary | ICD-10-CM | POA: Diagnosis not present

## 2021-06-30 LAB — RESP PANEL BY RT-PCR (FLU A&B, COVID) ARPGX2
Influenza A by PCR: POSITIVE — AB
Influenza B by PCR: NEGATIVE
SARS Coronavirus 2 by RT PCR: NEGATIVE

## 2021-06-30 MED ORDER — OSELTAMIVIR PHOSPHATE 75 MG PO CAPS: 75.0000 mg | ORAL_CAPSULE | Freq: Two times a day (BID) | ORAL | 0 refills | Status: DC

## 2021-06-30 NOTE — ED Provider Notes (Signed)
[facility] EMERGENCY DEPT Provider Note   CSN: [id number] Arrival date & time: 06/30/21  1052     History Chief Complaint  Patient presents with   Cough    Kristan Nilsson Gamblin is a 40 y.o. male.   Cough Associated symptoms: chills, diaphoresis, fever and myalgias   Associated symptoms: no chest pain, no ear pain, no rash, no shortness of breath and no sore throat   Patient presents for flulike symptoms since yesterday.  Symptoms include fevers, chills, myalgias, cough.  He has had some recent sick contacts.  He is concerned of pneumonia.  He is previously healthy.  He has had DVT in the past and continues to take Eliquis.  He denies any recent shortness of breath or chest pain.  He has been taking over-the-counter cold and flu medications.  Today, he has taken ibuprofen and TheraFlu.  He denies any vomiting or diarrhea.  He has been able to tolerate p.o. intake at home.    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  Social History   Tobacco Use   Smoking status: Never    Passive exposure: Never   Smokeless tobacco: Never  Vaping Use   Vaping Use: Never used  Substance Use Topics   Alcohol use: Not Currently   Drug use: Not Currently    Home Medications Prior to Admission medications   Medication Sig Start Date End Date Taking? Authorizing Provider  oseltamivir  (TAMIFLU) 75 MG capsule Take 1 capsule (75 mg total) by mouth every 12 (twelve) hours. 06/30/21  Yes Gloris Manchester, MD  amoxicillin-clavulanate (AUGMENTIN) 875-125 MG tablet Take 1 tablet by mouth every 12 (twelve) hours. 06/21/21   Terrilee Files, MD  apixaban (ELIQUIS) 5 MG TABS tablet Take 5 mg by mouth 2 (two) times daily.    [provider]  clomiPHENE (CLOMID) 50 MG tablet Take 1/2 tablet 3 times a week. 09/02/20   Reather Littler, MD  clomiPHENE (CLOMID) 50 MG tablet Take by mouth. 09/26/20   [provider]  ELIQUIS 5 MG TABS tablet Take 1 tablet by mouth 2 (two) times daily. 10/25/20   [provider]  human chorionic gonadotropin (PREGNYL/NOVAREL) 10000 units injection Inject 0.5 mLs (500 Units total) into the muscle 3 (three) times a week. 08/09/20   Shamleffer, Konrad Dolores, MD  human chorionic gonadotropin (PREGNYL/NOVAREL) 10000 units injection Inject into the muscle. 10/14/20   [provider]  INSULIN SYRINGE .5CC/29G (ADVOCATE INSULIN SYRINGE) 29G X 1/2" 0.5 ML MISC Total 6 x a week 09/13/20   Reather Littler, MD  testosterone cypionate (DEPOTESTOSTERONE CYPIONATE) 200 MG/ML injection Inject 0.2 mLs (40 mg total) into the muscle 3 (three) times a week. 09/02/20   Reather Littler, MD  testosterone cypionate (DEPOTESTOSTERONE CYPIONATE) 200 MG/ML injection Inject into the muscle. 10/20/20   [provider]    Allergies  Tegaderm ag mesh [silver]  Review of Systems   Review of Systems  Constitutional:  Positive for appetite change, chills, diaphoresis, fatigue and fever.  HENT:  Positive for congestion. Negative for ear pain and sore throat.   Eyes:  Negative for pain and visual disturbance.  Respiratory:  Positive for cough and chest tightness. Negative for shortness of breath.   Cardiovascular:  Negative for chest pain and palpitations.  Gastrointestinal:  Negative for abdominal pain, diarrhea, nausea and vomiting.  Genitourinary:  Negative for  dysuria and hematuria.  Musculoskeletal:  Positive for myalgias. Negative for arthralgias and back pain.  Skin:  Negative for color change and rash.  Neurological:  Negative for dizziness, seizures, syncope, weakness and numbness.  Psychiatric/Behavioral:  Negative for confusion and decreased concentration.   All other systems reviewed and are negative.  Physical Exam Updated Vital Signs BP 106/65   Pulse 84   Temp 98.4 F (36.9 C) (Oral)   Resp 18   Ht 6\' 6"  (1.981 m)   Wt 101.9 kg   SpO2 95%   BMI 25.96 kg/m   Physical Exam Vitals and nursing note reviewed.  Constitutional:      General: He is not in acute distress.    Appearance: Normal appearance. He is well-developed and normal weight. He is not ill-appearing, toxic-appearing or diaphoretic.  HENT:     Head: Normocephalic and atraumatic.     Right Ear: External ear normal.     Left Ear: External ear normal.     Nose: Congestion present.     Mouth/Throat:     Mouth: Mucous membranes are moist.     Pharynx: Oropharynx is clear.  Eyes:     Conjunctiva/sclera: Conjunctivae normal.  Cardiovascular:     Rate and Rhythm: Normal rate and regular rhythm.     Heart sounds: No murmur heard. Pulmonary:     Effort: Pulmonary effort is normal. No respiratory distress.     Breath sounds: Normal breath sounds. No wheezing or rales.  Chest:     Chest wall: No tenderness.  Abdominal:     Palpations: Abdomen is soft.     Tenderness: There is no abdominal tenderness.  Musculoskeletal:        General: No swelling.     Cervical back: Neck supple.     Right lower leg: No edema.     Left lower leg: No edema.  Skin:    General: Skin is warm and dry.     Capillary Refill: Capillary refill takes less than 2 seconds.     Coloration: Skin is not jaundiced or pale.  Neurological:     General: No focal deficit present.     Mental Status: He is alert and oriented to person, place, and time.     Cranial Nerves: No cranial nerve deficit.      Sensory: No sensory deficit.     Motor: No weakness.     Coordination: Coordination normal.     Gait: Gait normal.  Psychiatric:        Mood and Affect: Mood normal.        Behavior: Behavior normal.        Thought Content: Thought content normal.        Judgment: Judgment normal.    ED Results / Procedures / Treatments   Labs (all labs ordered are listed, but only abnormal results are displayed) Labs Reviewed  RESP PANEL BY RT-PCR (FLU A&B, COVID) ARPGX2 - Abnormal; Notable for the following  components:      Result Value   Influenza A by PCR POSITIVE (*)    All other components within normal limits    EKG None  Radiology DG Chest Portable 1 View  Result Date: 06/30/2021 CLINICAL DATA:  Fever, cough EXAM: PORTABLE CHEST 1 VIEW COMPARISON:  None. FINDINGS: The heart size and mediastinal contours are within normal limits. Both lungs are clear. The visualized skeletal structures are unremarkable. IMPRESSION: No active disease. Electronically Signed   By: Elmer Picker M.D.   On: 06/30/2021 11:27    Procedures Procedures   Medications Ordered in ED Medications - No data to display  ED Course  I have reviewed the triage vital signs and the nursing notes.  Pertinent labs & imaging results that were available during my care of the patient were reviewed by me and considered in my medical decision making (see chart for details).    MDM Rules/Calculators/A&P                          Patient is a 40 year old male with history of DVT, currently on Eliquis, presenting for 2 days of flulike symptoms.  On arrival in the ED, he is afebrile.  He does appear mildly uncomfortable on exam.  Breathing, however, is even and unlabored.  Lungs are clear to auscultation.  Chest x-ray was obtained which showed no evidence of focal opacities.  COVID/influenza swab was positive for influenza A.  I discussed the risks and benefits of Tamiflu.  Patient stated that he would like to undergo  this therapy.  Prescription was provided.  Patient was advised to continue supportive care at home and to return to the ED if he has worsening severity of his symptoms.  He was discharged in good condition.  Final Clinical Impression(s) / ED Diagnoses Final diagnoses:  Influenza A    Rx / DC Orders ED Discharge Orders          Ordered    oseltamivir (TAMIFLU) 75 MG capsule  Every 12 hours        06/30/21 1242             Godfrey Pick, MD 07/01/21 [phone]

## 2021-06-30 NOTE — ED Triage Notes (Signed)
Patient reports tot the ER for fever, cough, and chills. Patient reports his inlaws were recently sick with the same symptoms and were diagnosed with pneumonia so he was told by his significant other to come get checked.

## 2021-06-30 NOTE — ED Notes (Signed)
Pt d/c home per MD order. Discharge summary reviewed with pt, pt verbalizes understanding. Ambulatory off unit. No s/s of acute distress noted at discharge.  °

## 2021-09-12 ENCOUNTER — Other Ambulatory Visit: Payer: Self-pay | Admitting: Endocrinology

## 2021-12-08 ENCOUNTER — Telehealth (INDEPENDENT_AMBULATORY_CARE_PROVIDER_SITE_OTHER): Admitting: Physician Assistant

## 2021-12-08 DIAGNOSIS — R6889 Other general symptoms and signs: Secondary | ICD-10-CM

## 2021-12-08 DIAGNOSIS — Z20828 Contact with and (suspected) exposure to other viral communicable diseases: Secondary | ICD-10-CM | POA: Diagnosis not present

## 2021-12-08 MED ORDER — OSELTAMIVIR PHOSPHATE 75 MG PO CAPS: 75.0000 mg | ORAL_CAPSULE | Freq: Two times a day (BID) | ORAL | 0 refills | Status: DC

## 2021-12-08 NOTE — Progress Notes (Signed)

## 2021-12-08 NOTE — Progress Notes (Signed)
I have spent 5 minutes in review of e-visit questionnaire, review and updating patient chart, medical decision making and response to patient.   Brannon Levene Cody Idora Brosious, PA-C    

## 2021-12-18 ENCOUNTER — Other Ambulatory Visit: Payer: Self-pay | Admitting: Endocrinology

## 2021-12-21 ENCOUNTER — Telehealth: Admitting: Nurse Practitioner

## 2021-12-21 DIAGNOSIS — L249 Irritant contact dermatitis, unspecified cause: Secondary | ICD-10-CM | POA: Diagnosis not present

## 2021-12-21 MED ORDER — PREDNISONE 10 MG PO TABS: ORAL_TABLET | ORAL | 0 refills | Status: DC

## 2021-12-21 NOTE — Progress Notes (Signed)
I have spent 5 minutes in review of e-visit questionnaire, review and updating patient chart, medical decision making and response to patient.  ° °Havoc Sanluis W Edi Gorniak, NP ° °  °

## 2021-12-21 NOTE — Progress Notes (Signed)
E Visit for Rash  We are sorry that you are not feeling well. Here is how we plan to help!  Based on what you shared with me it looks like you have contact dermatitis.  Contact dermatitis is a skin rash caused by something that touches the skin and causes irritation or inflammation.  Your skin may be red, swollen, dry, cracked, and itch.  The rash should go away in a few days but can last a few weeks.  If you get a rash, it's important to figure out what caused it so the irritant can be avoided in the future.  I have prescribed Prednisone 10 mg daily for 6 days (see taper instructions below)  Directions for 6 day taper: Day 1: 2 tablets before breakfast, 1 after both lunch & dinner and 2 at bedtime Day 2: 1 tab before breakfast, 1 after both lunch & dinner and 2 at bedtime Day 3: 1 tab at each meal & 1 at bedtime Day 4: 1 tab at breakfast, 1 at lunch, 1 at bedtime Day 5: 1 tab at breakfast & 1 tab at bedtime Day 6: 1 tab at breakfast     HOME CARE:  Take cool showers and avoid direct sunlight. Apply cool compress or wet dressings. Take a bath in an oatmeal bath.  Sprinkle content of one Aveeno packet under running faucet with comfortably warm water.  Bathe for 15-20 minutes, 1-2 times daily.  Pat dry with a towel. Do not rub the rash. Use hydrocortisone cream. Take an antihistamine like Benadryl for widespread rashes that itch.  The adult dose of Benadryl is 25-50 mg by mouth 4 times daily. Caution:  This type of medication may cause sleepiness.  Do not drink alcohol, drive, or operate dangerous machinery while taking antihistamines.  Do not take these medications if you have prostate enlargement.  Read package instructions thoroughly on all medications that you take.  GET HELP RIGHT AWAY IF:  Symptoms don't go away after treatment. Severe itching that persists. If you rash spreads or swells. If you rash begins to smell. If it blisters and opens or develops a yellow-brown  crust. You develop a fever. You have a sore throat. You become short of breath.  MAKE SURE YOU:  Understand these instructions. Will watch your condition. Will get help right away if you are not doing well or get worse.  Thank you for choosing an e-visit.  Your e-visit answers were reviewed by a board certified advanced clinical practitioner to complete your personal care plan. Depending upon the condition, your plan could have included both over the counter or prescription medications.  Please review your pharmacy choice. Make sure the pharmacy is open so you can pick up prescription now. If there is a problem, you may contact your provider through [employer] and have the prescription routed to another pharmacy.  Your safety is important to [place]. If you have drug allergies check your prescription carefully.   For the next 24 hours you can use MyChart to ask questions about today's visit, request a non-urgent call back, or ask for a work or school excuse. You will get an email in the next two days asking about your experience. I hope that your e-visit has been valuable and will speed your recovery.

## 2022-04-14 ENCOUNTER — Telehealth: Admitting: Nurse Practitioner

## 2022-04-14 DIAGNOSIS — R5383 Other fatigue: Secondary | ICD-10-CM | POA: Insufficient documentation

## 2022-04-14 DIAGNOSIS — J342 Deviated nasal septum: Secondary | ICD-10-CM | POA: Insufficient documentation

## 2022-04-14 DIAGNOSIS — F4320 Adjustment disorder, unspecified: Secondary | ICD-10-CM | POA: Insufficient documentation

## 2022-04-14 DIAGNOSIS — I749 Embolism and thrombosis of unspecified artery: Secondary | ICD-10-CM | POA: Insufficient documentation

## 2022-04-14 DIAGNOSIS — U071 COVID-19: Secondary | ICD-10-CM | POA: Diagnosis not present

## 2022-04-14 DIAGNOSIS — S92309A Fracture of unspecified metatarsal bone(s), unspecified foot, initial encounter for closed fracture: Secondary | ICD-10-CM | POA: Insufficient documentation

## 2022-04-14 DIAGNOSIS — J309 Allergic rhinitis, unspecified: Secondary | ICD-10-CM | POA: Insufficient documentation

## 2022-04-14 DIAGNOSIS — I82409 Acute embolism and thrombosis of unspecified deep veins of unspecified lower extremity: Secondary | ICD-10-CM | POA: Insufficient documentation

## 2022-04-14 DIAGNOSIS — Z0289 Encounter for other administrative examinations: Secondary | ICD-10-CM | POA: Insufficient documentation

## 2022-04-14 NOTE — Progress Notes (Signed)
Virtual Visit Consent   Vincent Warner, you are scheduled for a virtual visit with a [facility] provider today. Just as with appointments in the office, your consent must be obtained to participate. Your consent will be active for this visit and any virtual visit you may have with one of our providers in the next 365 days. If you have a MyChart account, a copy of this consent can be sent to you electronically.  As this is a virtual visit, video technology does not allow for your provider to perform a traditional examination. This may limit your provider's ability to fully assess your condition. If your provider identifies any concerns that need to be evaluated in person or the need to arrange testing (such as labs, EKG, etc.), we will make arrangements to do so. Although advances in technology are sophisticated, we cannot ensure that it will always work on either your end or our end. If the connection with a video visit is poor, the visit may have to be switched to a telephone visit. With either a video or telephone visit, we are not always able to ensure that we have a secure connection.  By engaging in this virtual visit, you consent to the provision of healthcare and authorize for your insurance to be billed (if applicable) for the services provided during this visit. Depending on your insurance coverage, you may receive a charge related to this service.  I need to obtain your verbal consent now. Are you willing to proceed with your visit today? Vincent Warner has provided verbal consent on 04/14/2022 for a virtual visit (video or telephone). Vincent Simas, FNP  Date: 04/14/2022 11:30 AM  Virtual Visit via Video Note   I, Vincent Warner, connected with  Vincent Warner  ([id number], 43-04-1981) on 04/14/22 at 11:30 AM EDT by a video-enabled telemedicine application and verified that I am speaking with the correct person using two identifiers.  Location: Patient: Virtual Visit Location Patient:  Home Provider: Virtual Visit Location Provider: Home Office   I discussed the limitations of evaluation and management by telemedicine and the availability of in person appointments. The patient expressed understanding and agreed to proceed.    History of Present Illness: Vincent Warner is a 41 y.o. who identifies as a male who was assigned male at birth, and is being seen today after testing positive for COVID-19 last night.   He started feeling sick yesterday.   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 He is typically treated with Rocephin and Prednisone.  He is actively trying to conceive.    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   Environmental and seasonal allergies 12/12/2019   Family history of colon cancer 12/12/2019   Attention deficit hyperactivity disorder (ADHD), 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) 06/08/2019   Attention deficit 10/11/2018   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   H/O secondary hypogonadism 07/19/2018   Hx of deep venous thrombosis 07/19/2018   Chronic venous hypertension with ulcer, left 05/31/2017   Acute bilateral low back pain without sciatica 01/07/2017    Chronic anticoagulation 12/04/2016   Lateral epicondylitis 05/29/2015   Edema of left lower extremity 04/05/2015   Coagulopathy ([place]) 11/06/2014    Allergies:  Allergies  Allergen Reactions   Tape Rash   Wound Dressing Adhesive Rash   Tegaderm Ag Mesh [Silver] Rash   Medications:  Current Outpatient Medications  Medication Instructions   ALPRAZolam (XANAX) 0.25 mg, Oral, 2 times daily PRN   amphetamine-dextroamphetamine (ADDERALL) 20 MG tablet 20 mg, Oral, Daily   apixaban (ELIQUIS) 5 mg, Oral, 2 times daily   clomiPHENE (CLOMID) 50 MG tablet Take 1/2 tablet 3 times a week.   clomiPHENE (CLOMID) 50 MG tablet Oral   ELIQUIS 5 MG TABS tablet 1 tablet, Oral, 2 times daily   human chorionic gonadotropin (PREGNYL/NOVAREL) 10000 units injection Intramuscular   human chorionic gonadotropin (PREGNYL/NOVAREL) 500 Units, Intramuscular, 3 times weekly   INSULIN SYRINGE .5CC/29G (SURE COMFORT INS SYR .5CC/29G) 29G X 1/2" 0.5 ML MISC USE TOTAL OF 6 SYRINGES A WEEK   sildenafil (VIAGRA) 100 MG tablet TAKE ONE TABLET BY MOUTH AS INSTRUCTED PRN   traZODone (DESYREL) 50 mg, Oral, Daily at bedtime   XIIDRA 5 % SOLN No dose, route, or frequency recorded.    Observations/Objective: Patient is well-developed, well-nourished in no acute distress.  Resting comfortably at home.  Head is normocephalic, atraumatic.  No labored breathing.  Speech is clear and coherent with logical content.  Patient is alert and oriented at baseline.    Assessment and Plan: 1. COVID-19 Drug to Drug interactions exist for Paxlovid  Molnupiravir contraindicated due to current plans and attempts for reproduction.   Discussed options with patient. He would like to avoid anti-viral due to current active plan for reproduction.   Advised that if sore throat persists he would need to visit UC or PCP for Rocephin administration.        Follow Up Instructions: I discussed the assessment and treatment plan with the  patient. The patient was provided an opportunity to ask questions and all were answered. The patient agreed with the plan and demonstrated an understanding of the instructions.  A copy of instructions were sent to the patient via MyChart unless otherwise noted below.    The patient was advised to call back or seek an in-person evaluation if the symptoms worsen or if the condition fails to improve as anticipated.  Time:  I spent 10 minutes with the patient via telehealth technology discussing the above problems/concerns.    Apolonio Schneiders, FNP

## 2022-12-02 ENCOUNTER — Other Ambulatory Visit: Payer: Self-pay | Admitting: Endocrinology

## 2023-01-24 IMAGING — CT CT NECK W/ CM
5 series · 16 of 33 positions shown, 18 images · IV contrast (omnipaque)
Comparison: None.

CLINICAL DATA: Chills, sore throat

EXAM:
CT NECK WITH CONTRAST
TECHNIQUE: Multidetector CT imaging of the neck was performed using the
standard protocol following the bolus administration of intravenous
contrast.
CONTRAST:  75mL OMNIPAQUE IOHEXOL 300 MG/ML  SOLN

[Series 2: axial neck (person_name) · axial · 0.67mm/px · z∈[-469,-369]mm · 2 of 152 slices shown]
[im 51/152  bone]
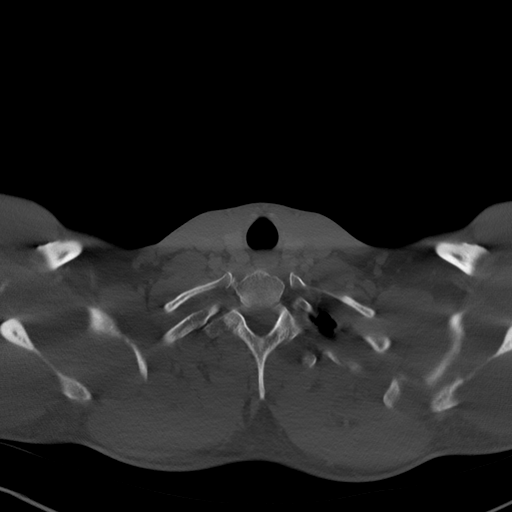
[im 101/152  bone]
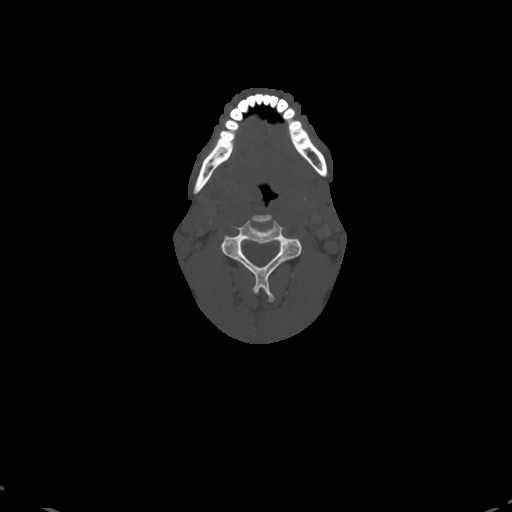

[Series 3: ax bone · axial · 0.67mm/px · z∈[-495,-343]mm · 3 of 152 slices shown, 4 images]
[im 38/152  soft-tissue]
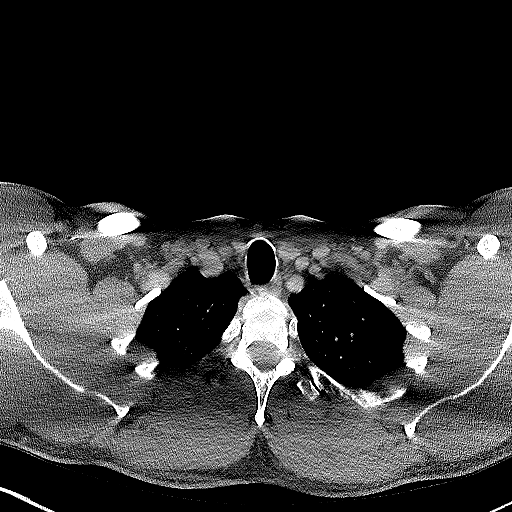
[im 38/152  bone]
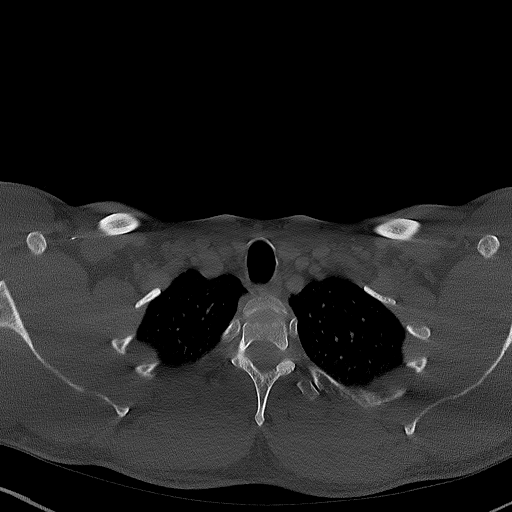
[im 76/152  bone]
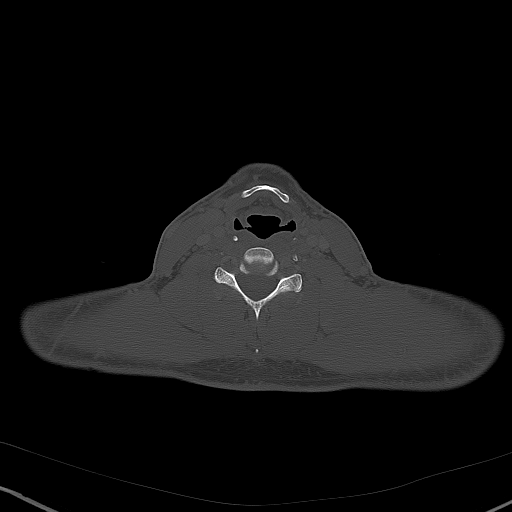
[im 114/152  bone]
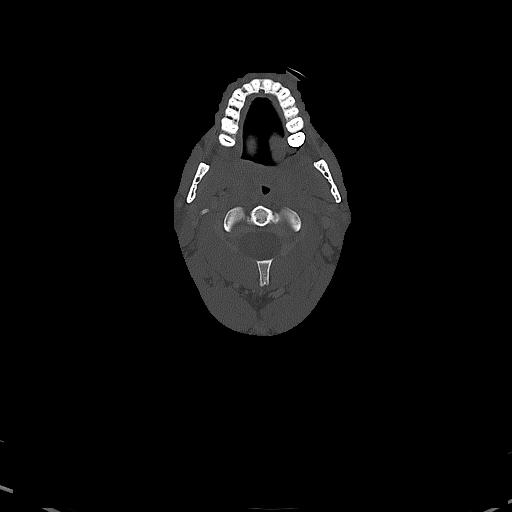

[Series 4: cor neck · coronal · 0.61mm/px · 3 of 166 slices shown]
[im 56/166  bone]
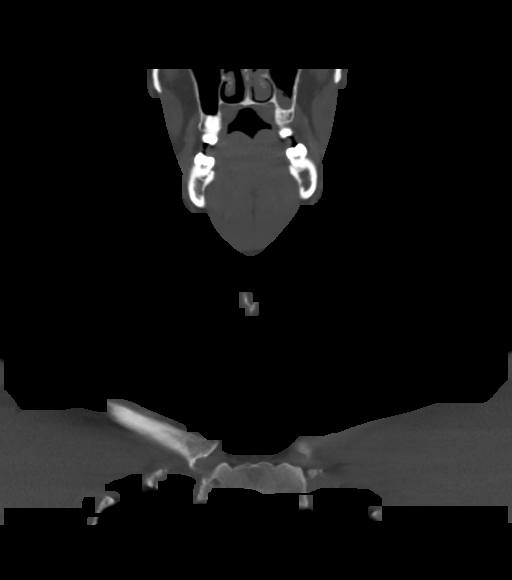
[im 74/166  bone]
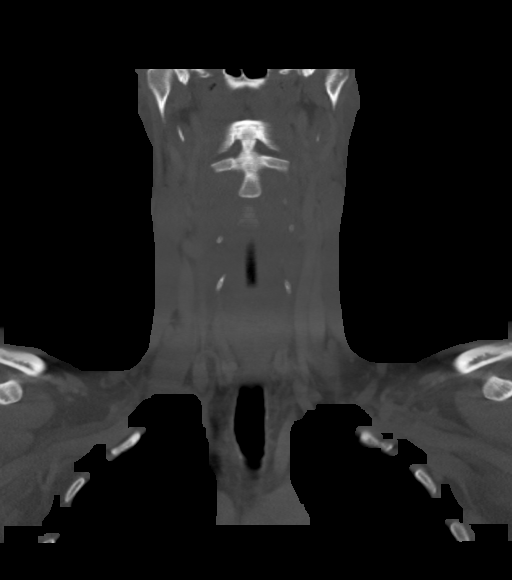
[im 92/166  bone]
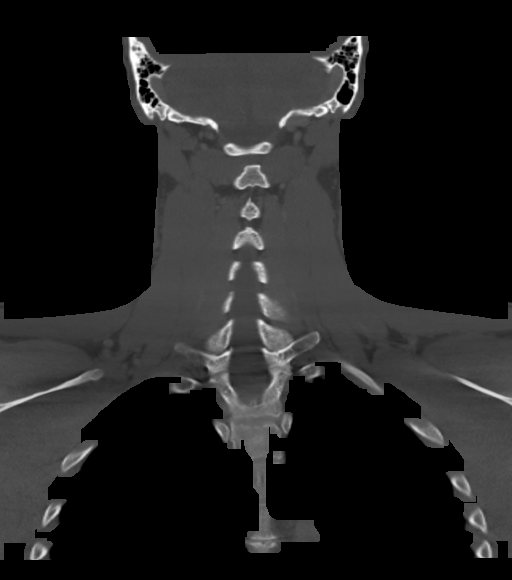

[Series 5: sag neck · sagittal · 0.63mm/px · 5 of 155 slices shown, 6 images]
[im 52/155  bone]
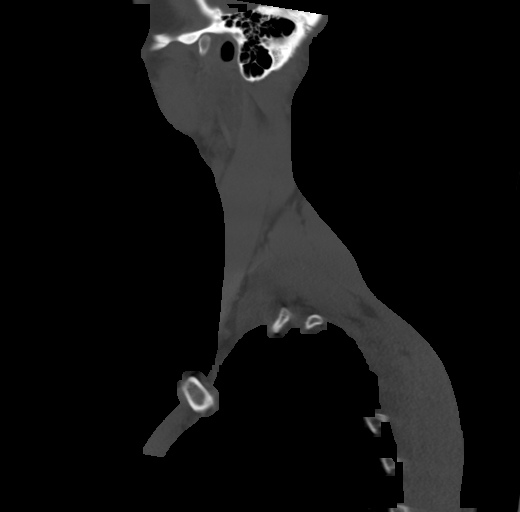
[im 65/155  bone]
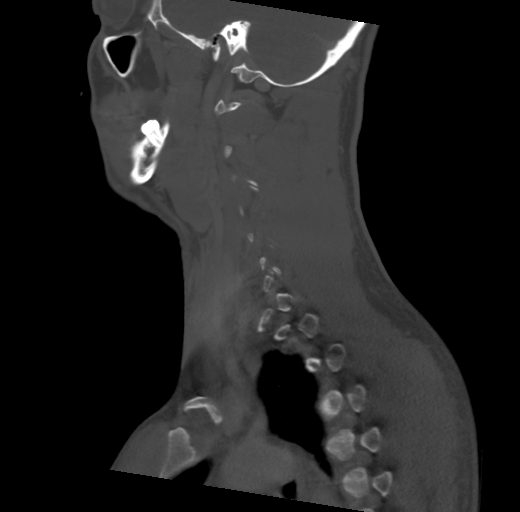
[im 78/155  soft-tissue]
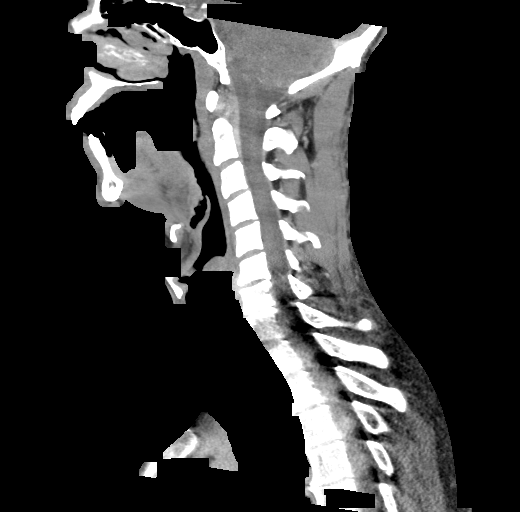
[im 78/155  bone]
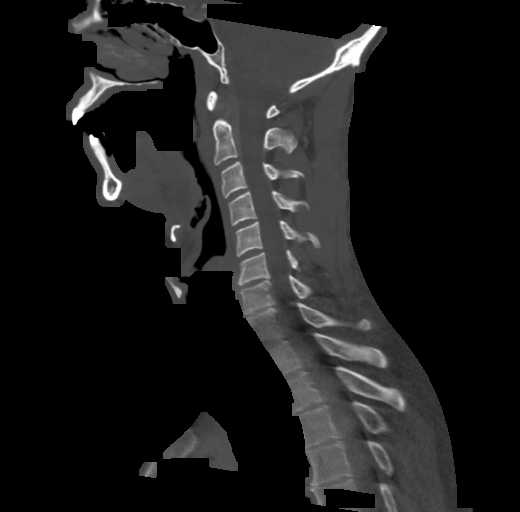
[im 90/155  bone]
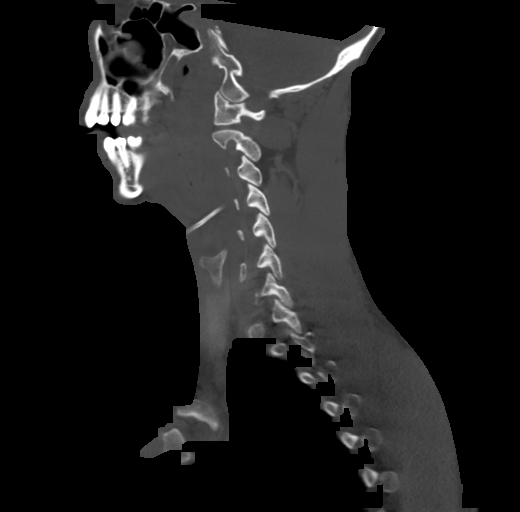
[im 103/155  bone]
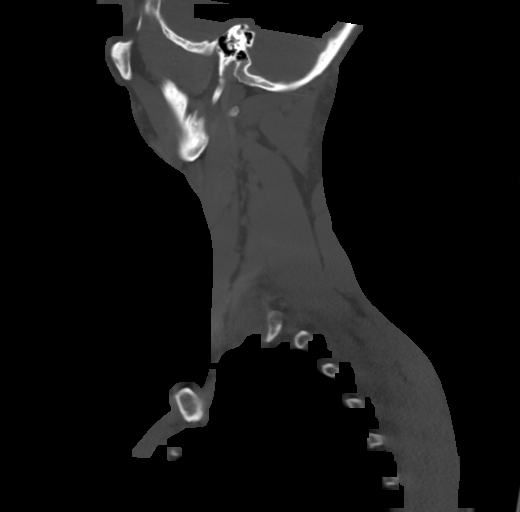

[Series 6: ax oropharynx · axial · 0.61mm/px · z∈[-493,-337]mm · 3 of 157 slices shown]
[im 40/157  bone]
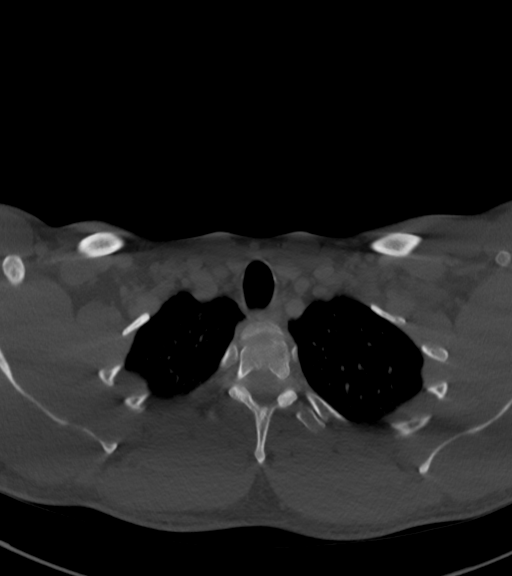
[im 79/157  bone]
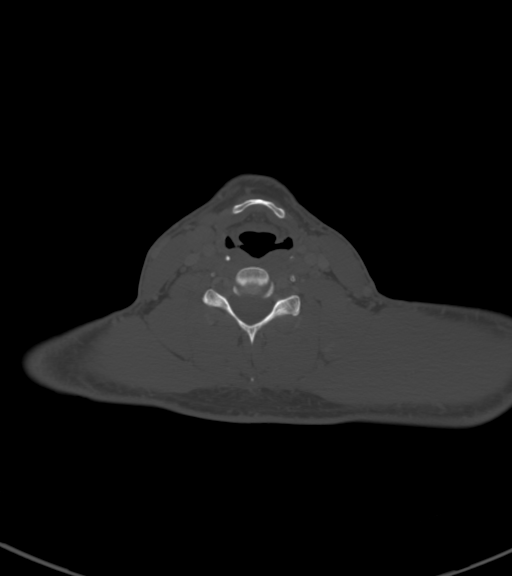
[im 118/157  bone]
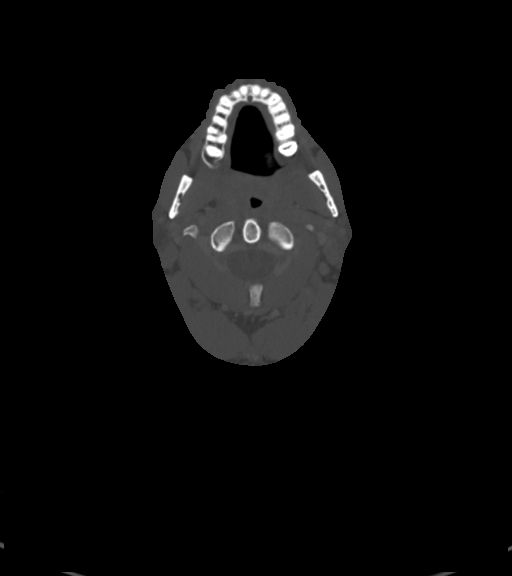

[16 of 33 positions shown; findings below may reference images not displayed]

FINDINGS: Pharynx and larynx: There is enlargement of the palatine tonsils
with mildly heterogeneous enhancement. No peritonsillar abscess.
Airway is narrowed but patent. Larynx is unremarkable.

Salivary glands: Parotid and submandibular glands are unremarkable.

Thyroid: Normal.

Lymph nodes: There is likely reactive upper cervical
lymphadenopathy. For example, right level 2 node measuring 2.2 cm.

Vascular: Major neck vessels are patent.

Limited intracranial: No abnormal enhancement.

Visualized orbits: Unremarkable.

Mastoids and visualized paranasal sinuses: Mild mucosal thickening.
Mastoid air cells are clear.

Skeleton: No acute or significant osseous abnormality.

Upper chest: Included upper lungs are clear.

Other: None.
IMPRESSION: Pharyngitis/tonsillitis without evidence of peritonsillar abscess.

## 2023-07-07 IMAGING — DX DG CHEST 1V PORT
1 series · 2 of 2 positions shown · non-contrast
Comparison: None.

CLINICAL DATA: Fever, cough

EXAM:
PORTABLE CHEST 1 VIEW

[Series 1: chest · 0.14mm/px · 2 of 2 slices shown]
[im 1/2]
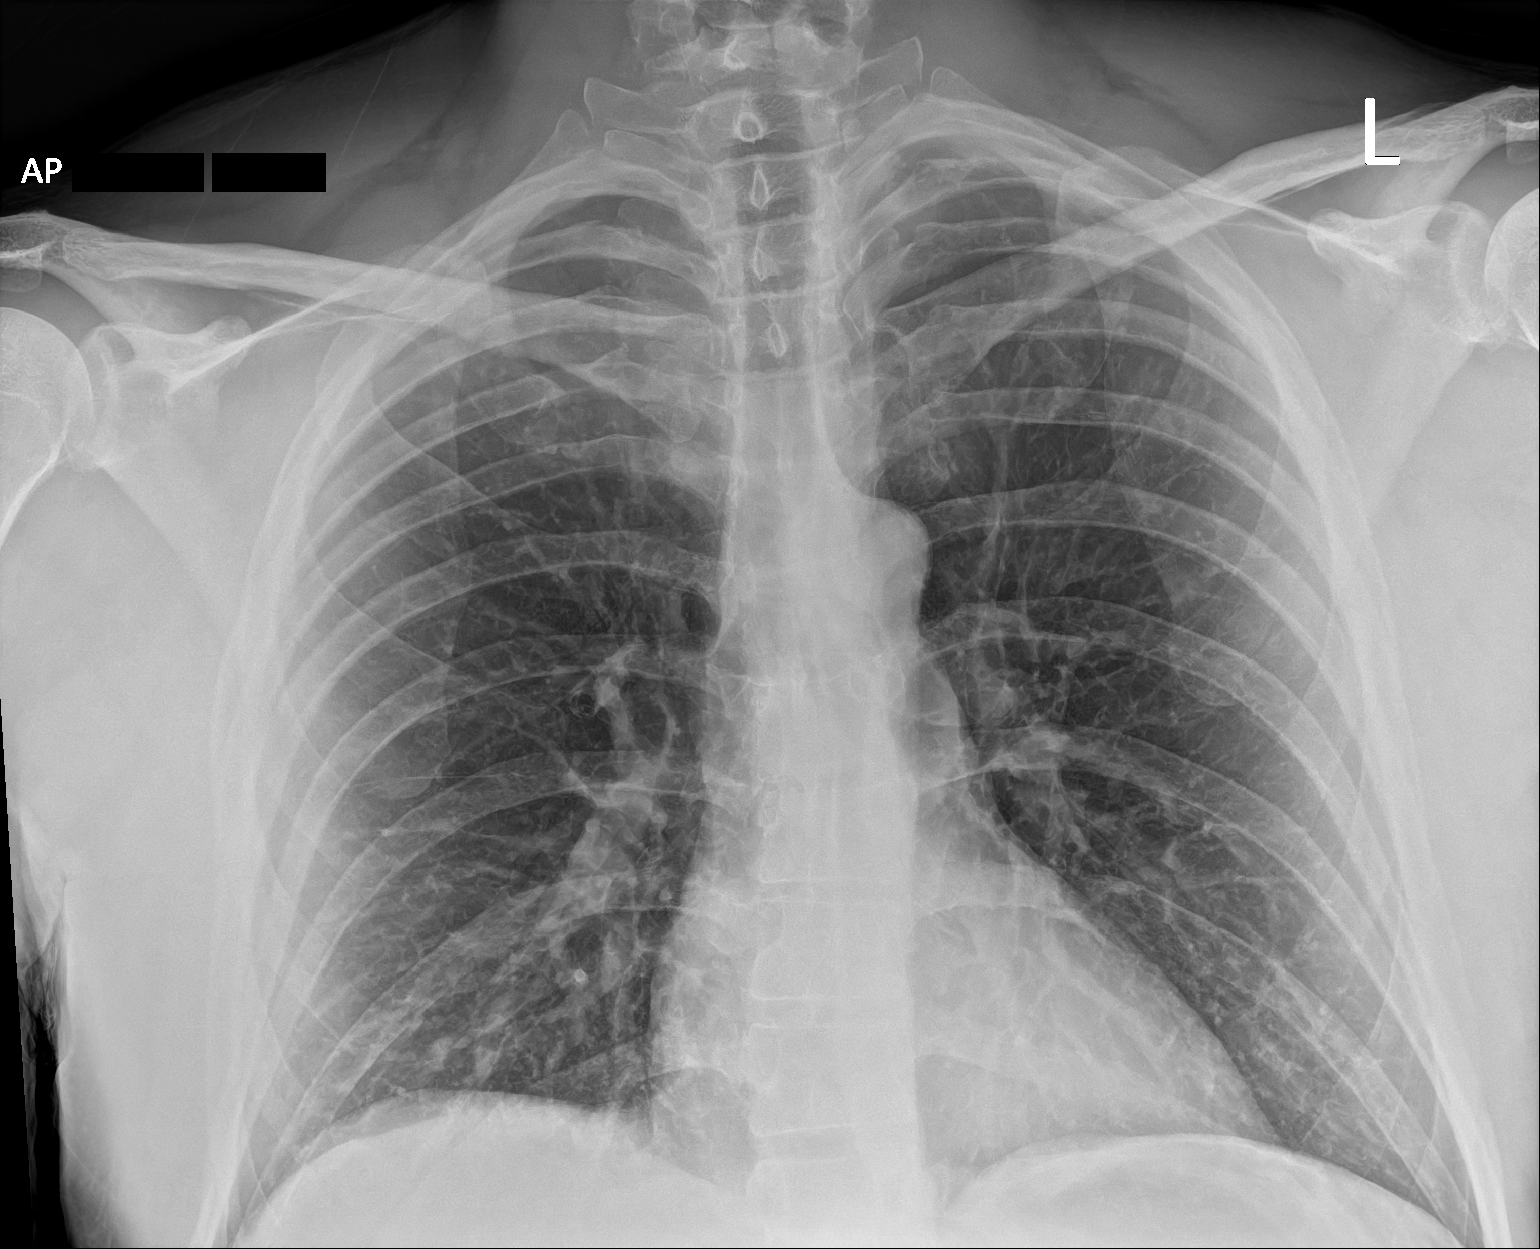
[im 2/2]
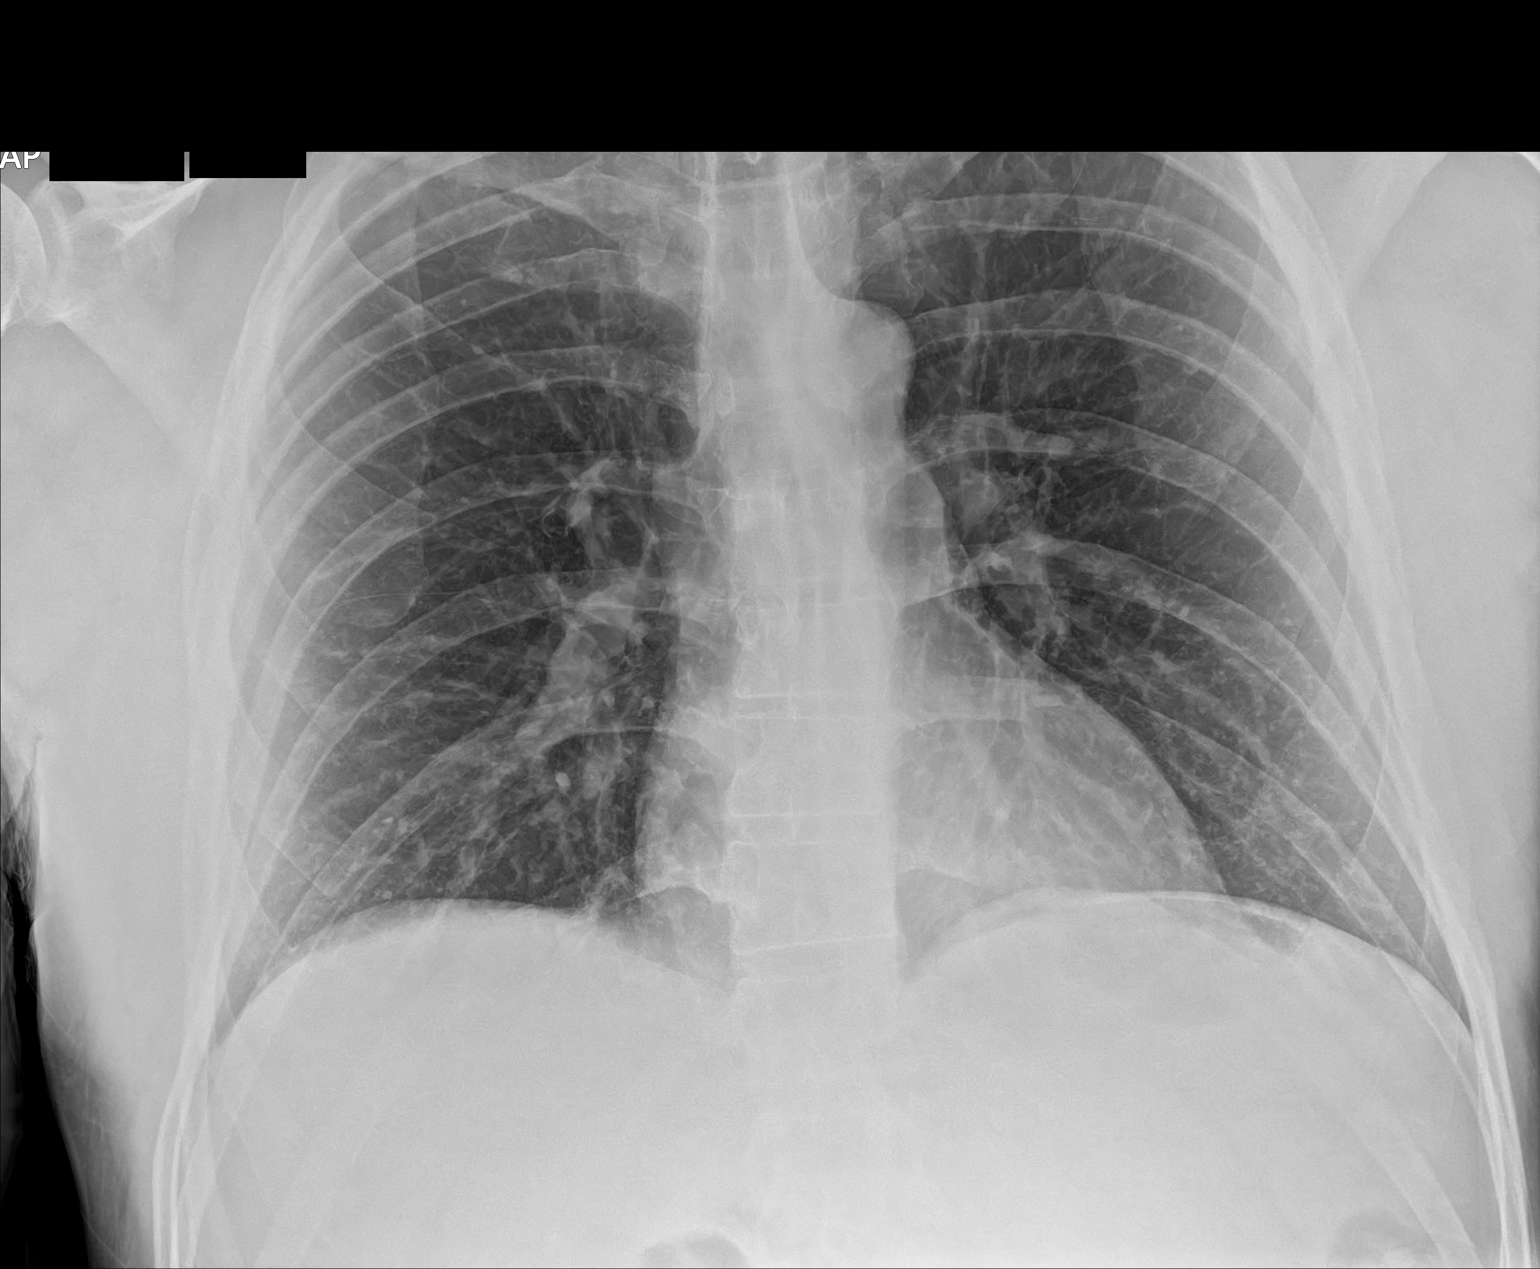

[2 of 2 positions shown; findings below may reference images not displayed]

FINDINGS: The heart size and mediastinal contours are within normal limits.
Both lungs are clear. The visualized skeletal structures are
unremarkable.
IMPRESSION: No active disease.

## 2024-04-29 ENCOUNTER — Telehealth: Admitting: Nurse Practitioner

## 2024-04-29 DIAGNOSIS — B9689 Other specified bacterial agents as the cause of diseases classified elsewhere: Secondary | ICD-10-CM | POA: Diagnosis not present

## 2024-04-29 DIAGNOSIS — J019 Acute sinusitis, unspecified: Secondary | ICD-10-CM

## 2024-04-29 MED ORDER — IPRATROPIUM BROMIDE 0.03 % NA SOLN: 2.0000 | Freq: Two times a day (BID) | NASAL | 0 refills | Status: DC

## 2024-04-29 MED ORDER — AMOXICILLIN-POT CLAVULANATE 875-125 MG PO TABS: 1.0000 | ORAL_TABLET | Freq: Two times a day (BID) | ORAL | 0 refills | Status: AC

## 2024-04-29 NOTE — Progress Notes (Signed)
 E-Visit for Sinus Problems  We are sorry that you are not feeling well.  Here is how we plan to help!  Based on what you have shared with me it looks like you have sinusitis.  Sinusitis is inflammation and infection in the sinus cavities of the head.  Based on your presentation I believe you most likely have Acute Bacterial Sinusitis.  This is an infection caused by bacteria and is treated with antibiotics. I have prescribed Augmentin  875mg /125mg  one tablet twice daily with food, for 7 days. and I have also prescribed Ipratropium Bromide Nasal Spray Use 1 spray in each nostril twice daily as needed for drainage; discontinue if too drying You may use an oral decongestant such as Mucinex D or if you have glaucoma or high blood pressure use plain Mucinex. Saline nasal spray help and can safely be used as often as needed for congestion.  If you develop worsening sinus pain, fever or notice severe headache and vision changes, or if symptoms are not better after completion of antibiotic, please schedule an appointment with a health care provider.    Sinus infections are not as easily transmitted as other respiratory infection, however we still recommend that you avoid close contact with loved ones, especially the very young and elderly.  Remember to wash your hands thoroughly throughout the day as this is the number one way to prevent the spread of infection!  Home Care: Only take medications as instructed by your medical team. Complete the entire course of an antibiotic. Do not take these medications with alcohol. A steam or ultrasonic humidifier can help congestion.  You can place a towel over your head and breathe in the steam from hot water coming from a faucet. Avoid close contacts especially the very young and the elderly. Cover your mouth when you cough or sneeze. Always remember to wash your hands.  Get Help Right Away If: You develop worsening fever or sinus pain. You develop a severe head  ache or visual changes. Your symptoms persist after you have completed your treatment plan.  Make sure you Understand these instructions. Will watch your condition. Will get help right away if you are not doing well or get worse.  Your e-visit answers were reviewed by a board certified advanced clinical practitioner to complete your personal care plan.  Depending on the condition, your plan could have included both over the counter or prescription medications.  If there is a problem please reply  once you have received a response from your provider.  Your safety is important to us .  If you have drug allergies check your prescription carefully.    You can use MyChart to ask questions about today's visit, request a non-urgent call back, or ask for a work or school excuse for 24 hours related to this e-Visit. If it has been greater than 24 hours you will need to follow up with your provider, or enter a new e-Visit to address those concerns.  You will get an e-mail in the next two days asking about your experience.  I hope that your e-visit has been valuable and will speed your recovery. Thank you for using e-visits.  I have spent 5 minutes in review of e-visit questionnaire, review and updating patient chart, medical decision making and response to patient.   Deshaun Schou W Jonathan Kirkendoll, NP

## 2024-06-07 ENCOUNTER — Telehealth: Admitting: Physician Assistant

## 2024-06-07 DIAGNOSIS — J069 Acute upper respiratory infection, unspecified: Secondary | ICD-10-CM

## 2024-06-07 MED ORDER — PSEUDOEPH-BROMPHEN-DM 30-2-10 MG/5ML PO SYRP: 5.0000 mL | ORAL_SOLUTION | Freq: Four times a day (QID) | ORAL | 0 refills | Status: AC | PRN

## 2024-06-07 MED ORDER — FLUTICASONE PROPIONATE 50 MCG/ACT NA SUSP: 2.0000 | Freq: Every day | NASAL | 0 refills | Status: AC

## 2024-06-07 MED ORDER — PREDNISONE 20 MG PO TABS: 40.0000 mg | ORAL_TABLET | Freq: Every day | ORAL | 0 refills | Status: AC

## 2024-06-07 NOTE — Addendum Note (Signed)
 Addended by: VIVIENNE DELON HERO on: 06/07/2024 06:39 PM   Modules accepted: Orders

## 2024-06-07 NOTE — Progress Notes (Signed)

## 2024-06-08 ENCOUNTER — Telehealth: Admitting: Physician Assistant

## 2024-06-08 DIAGNOSIS — B9689 Other specified bacterial agents as the cause of diseases classified elsewhere: Secondary | ICD-10-CM

## 2024-06-08 NOTE — Progress Notes (Signed)
     Because you were recently seen via evisit for similar symptoms, I feel your condition warrants further evaluation and I recommend that you be seen in a face-to-face visit.   NOTE: There will be NO CHARGE for this E-Visit   If you are having a true medical emergency, please call 911.     For an urgent face to face visit, [facility] has multiple urgent care centers for your convenience.  Click the link below for the full list of locations and hours, walk-in wait times, appointment scheduling options and driving directions:  Urgent Care - [place], [place], [place], [place], [place], [place]  [place]     Your MyChart E-visit questionnaire answers were reviewed by a board certified advanced clinical practitioner to complete your personal care plan based on your specific symptoms.    Thank you for using e-Visits.
